# Patient Record
Sex: Male | Born: 1955 | Race: White | Hispanic: No | Marital: Married | Smoking: Former smoker
Health system: Southern US, Community
[De-identification: ages and names within clinical notes are randomized; demographics above are authoritative.]

## PROBLEM LIST (undated history)

## (undated) DIAGNOSIS — N2889 Other specified disorders of kidney and ureter: Secondary | ICD-10-CM

## (undated) DIAGNOSIS — M1990 Unspecified osteoarthritis, unspecified site: Secondary | ICD-10-CM

## (undated) DIAGNOSIS — C61 Malignant neoplasm of prostate: Secondary | ICD-10-CM

## (undated) DIAGNOSIS — E785 Hyperlipidemia, unspecified: Secondary | ICD-10-CM

## (undated) HISTORY — PX: APPENDECTOMY: SHX54

## (undated) HISTORY — PX: HAND SURGERY: SHX662

## (undated) HISTORY — PX: KNEE SURGERY: SHX244

---

## 2007-08-11 ENCOUNTER — Emergency Department: Payer: Self-pay | Admitting: Emergency Medicine

## 2009-11-14 ENCOUNTER — Emergency Department (HOSPITAL_COMMUNITY): Admission: EM | Admit: 2009-11-14 | Discharge: 2009-11-15 | Payer: Self-pay | Admitting: Emergency Medicine

## 2010-04-10 LAB — CBC
HCT: 42.3 % (ref 39.0–52.0)
Hemoglobin: 15 g/dL (ref 13.0–17.0)
MCH: 32.9 pg (ref 26.0–34.0)
MCV: 93 fL (ref 78.0–100.0)
RBC: 4.55 MIL/uL (ref 4.22–5.81)

## 2010-04-10 LAB — POCT CARDIAC MARKERS
CKMB, poc: <1 ng/mL — ABNORMAL LOW (ref 1.0–8.0)
Troponin i, poc: <0.05 ng/mL (ref 0.00–0.09)
Troponin i, poc: <0.05 ng/mL (ref 0.00–0.09)

## 2010-04-10 LAB — DIFFERENTIAL
Basophils Absolute: 0.1 10*3/uL (ref 0.0–0.1)
Basophils Relative: 1 % (ref 0–1)
Eosinophils Relative: 3 % (ref 0–5)
Lymphocytes Relative: 31 % (ref 12–46)
Neutro Abs: 4.8 10*3/uL (ref 1.7–7.7)

## 2010-04-10 LAB — COMPREHENSIVE METABOLIC PANEL
BUN: 15 mg/dL (ref 6–23)
CO2: 27 mEq/L (ref 19–32)
Chloride: 105 mEq/L (ref 96–112)
Creatinine, Ser: 1.26 mg/dL (ref 0.4–1.5)
GFR calc non Af Amer: 59 mL/min — ABNORMAL LOW (ref >60)
Total Bilirubin: 0.5 mg/dL (ref 0.3–1.2)

## 2018-11-17 DIAGNOSIS — F17200 Nicotine dependence, unspecified, uncomplicated: Secondary | ICD-10-CM | POA: Diagnosis not present

## 2018-11-17 DIAGNOSIS — Z1159 Encounter for screening for other viral diseases: Secondary | ICD-10-CM | POA: Diagnosis not present

## 2018-11-17 DIAGNOSIS — Z23 Encounter for immunization: Secondary | ICD-10-CM | POA: Diagnosis not present

## 2018-11-17 DIAGNOSIS — Z136 Encounter for screening for cardiovascular disorders: Secondary | ICD-10-CM | POA: Diagnosis not present

## 2018-11-17 DIAGNOSIS — Z125 Encounter for screening for malignant neoplasm of prostate: Secondary | ICD-10-CM | POA: Diagnosis not present

## 2018-11-17 DIAGNOSIS — Z0000 Encounter for general adult medical examination without abnormal findings: Secondary | ICD-10-CM | POA: Diagnosis not present

## 2018-11-17 DIAGNOSIS — R9431 Abnormal electrocardiogram [ECG] [EKG]: Secondary | ICD-10-CM | POA: Diagnosis not present

## 2018-11-17 DIAGNOSIS — R030 Elevated blood-pressure reading, without diagnosis of hypertension: Secondary | ICD-10-CM | POA: Diagnosis not present

## 2018-11-17 DIAGNOSIS — Z1211 Encounter for screening for malignant neoplasm of colon: Secondary | ICD-10-CM | POA: Diagnosis not present

## 2018-11-17 DIAGNOSIS — I4510 Unspecified right bundle-branch block: Secondary | ICD-10-CM | POA: Diagnosis not present

## 2018-11-22 ENCOUNTER — Other Ambulatory Visit: Payer: Self-pay | Admitting: Family Medicine

## 2018-11-22 DIAGNOSIS — Z136 Encounter for screening for cardiovascular disorders: Secondary | ICD-10-CM

## 2018-12-02 ENCOUNTER — Ambulatory Visit
Admission: RE | Admit: 2018-12-02 | Discharge: 2018-12-02 | Disposition: A | Discharge status: Home / Self Care | Payer: HMO | Source: Ambulatory Visit | Attending: Family Medicine | Admitting: Family Medicine

## 2018-12-02 DIAGNOSIS — Z136 Encounter for screening for cardiovascular disorders: Secondary | ICD-10-CM

## 2018-12-02 DIAGNOSIS — Z87891 Personal history of nicotine dependence: Secondary | ICD-10-CM | POA: Diagnosis not present

## 2018-12-07 DIAGNOSIS — R3912 Poor urinary stream: Secondary | ICD-10-CM | POA: Diagnosis not present

## 2018-12-07 DIAGNOSIS — N401 Enlarged prostate with lower urinary tract symptoms: Secondary | ICD-10-CM | POA: Diagnosis not present

## 2018-12-07 DIAGNOSIS — R350 Frequency of micturition: Secondary | ICD-10-CM | POA: Diagnosis not present

## 2018-12-07 DIAGNOSIS — R351 Nocturia: Secondary | ICD-10-CM | POA: Diagnosis not present

## 2018-12-07 DIAGNOSIS — R972 Elevated prostate specific antigen [PSA]: Secondary | ICD-10-CM | POA: Diagnosis not present

## 2018-12-22 ENCOUNTER — Ambulatory Visit: Payer: Self-pay | Admitting: Cardiovascular Disease

## 2019-01-13 DIAGNOSIS — C61 Malignant neoplasm of prostate: Secondary | ICD-10-CM | POA: Diagnosis not present

## 2019-01-13 DIAGNOSIS — R972 Elevated prostate specific antigen [PSA]: Secondary | ICD-10-CM | POA: Diagnosis not present

## 2019-01-19 DIAGNOSIS — C61 Malignant neoplasm of prostate: Secondary | ICD-10-CM | POA: Diagnosis not present

## 2019-02-04 ENCOUNTER — Ambulatory Visit: Payer: PPO | Admitting: Cardiovascular Disease

## 2019-02-04 ENCOUNTER — Encounter: Payer: Self-pay | Admitting: Cardiovascular Disease

## 2019-02-04 ENCOUNTER — Other Ambulatory Visit: Payer: Self-pay

## 2019-02-04 DIAGNOSIS — I4510 Unspecified right bundle-branch block: Secondary | ICD-10-CM | POA: Diagnosis not present

## 2019-02-04 DIAGNOSIS — E782 Mixed hyperlipidemia: Secondary | ICD-10-CM

## 2019-02-04 DIAGNOSIS — E785 Hyperlipidemia, unspecified: Secondary | ICD-10-CM | POA: Insufficient documentation

## 2019-02-04 DIAGNOSIS — Z720 Tobacco use: Secondary | ICD-10-CM | POA: Diagnosis not present

## 2019-02-04 DIAGNOSIS — Z01818 Encounter for other preprocedural examination: Secondary | ICD-10-CM | POA: Diagnosis not present

## 2019-02-04 DIAGNOSIS — I454 Nonspecific intraventricular block: Secondary | ICD-10-CM

## 2019-02-04 HISTORY — DX: Nonspecific intraventricular block: I45.4

## 2019-02-04 MED ORDER — ATORVASTATIN CALCIUM 40 MG PO TABS: 40.0000 mg | ORAL_TABLET | Freq: Every day | ORAL | 3 refills | Status: DC

## 2019-02-04 NOTE — Assessment & Plan Note (Signed)
Greater than 100 pack years but tobacco use smoking 2 packs a day until recently when he is cut back to 6 cigarettes a day with the desire to quit

## 2019-02-04 NOTE — Progress Notes (Signed)
02/04/2019 Jeremiah Richards   September 29, 1955  [id number]  Primary Physician Patient, No Pcp Per Primary Cardiologist: Lorretta Harp MD Garret Reddish, [place], [place]  HPI:  Jeremiah Richards is a 64 y.o. 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  He drives 2 to [id number] miles a day.  He was referred by Marilynne Drivers , PA-C because of right bundle branch block and preoperative clearance.  He has no prior cardiac history.  His risk factors include close to 100 pack years of tobacco abuse smoking 2 packs a day for last 50 years and currently down to 6 cigarettes a day with the intent to quit.  He has treated hyperlipidemia.  Is never had a heart attack or stroke.  There is no family history of heart disease.  He denies chest pain or shortness of breath.  He does have right bundle branch block of unknown chronicity.  He was recently diagnosed with prostate cancer and apparently needs radical prostatectomy as well as bad knees potentially needing knee replacement.  He denies chest pain or shortness of breath.   Current Meds  Medication Sig  . atorvastatin (LIPITOR) 10 MG tablet Take 10 mg by mouth daily.     No Known Allergies  Social History   Socioeconomic History  . Marital status: Married    Spouse name: Not on file  . Number of children: Not on file  . Years of education: Not on file  . Highest education level: Not on file  Occupational History  . Not on file  Tobacco Use  . Smoking status: Not on file  Substance and Sexual Activity  . Alcohol use: Not on file  . Drug use: Not on file  . Sexual activity: Not on file  Other Topics Concern  . Not on file  Social History Narrative  . Not on file   Social Determinants of Health   Financial Resource Strain:   . Difficulty of Paying Living Expenses: Not on file  Food Insecurity:   . Worried About Charity fundraiser in the Last Year: Not on file  . Ran Out of Food in the Last  Year: Not on file  Transportation Needs:   . Lack of Transportation (Medical): Not on file  . Lack of Transportation (Non-Medical): Not on file  Physical Activity:   . Days of Exercise per Week: Not on file  . Minutes of Exercise per Session: Not on file  Stress:   . Feeling of Stress : Not on file  Social Connections:   . Frequency of Communication with Friends and Family: Not on file  . Frequency of Social Gatherings with Friends and Family: Not on file  . Attends Religious Services: Not on file  . Active Member of Clubs or Organizations: Not on file  . Attends Archivist Meetings: Not on file  . Marital Status: Not on file  Intimate Partner Violence:   . Fear of Current or Ex-Partner: Not on file  . Emotionally Abused: Not on file  . Physically Abused: Not on file  . Sexually Abused: Not on file     Review of Systems: General: negative for chills, fever, night sweats or weight changes.  Cardiovascular: negative for chest pain, dyspnea on exertion, edema, orthopnea, palpitations, paroxysmal nocturnal dyspnea or shortness of breath Dermatological: negative for rash Respiratory: negative for cough or wheezing Urologic: negative for hematuria Abdominal: negative for nausea, vomiting,  diarrhea, bright red blood per rectum, melena, or hematemesis Neurologic: negative for visual changes, syncope, or dizziness All other systems reviewed and are otherwise negative except as noted above.    Blood pressure (!) 149/102, pulse 72, temperature (!) 96 F (35.6 C), height 6\' 2"  (1.88 m), weight 220 lb (99.8 kg), SpO2 96 %.  General appearance: alert and no distress Neck: no adenopathy, no carotid bruit, no JVD, supple, symmetrical, trachea midline and thyroid not enlarged, symmetric, no tenderness/mass/nodules Lungs: clear to auscultation bilaterally Heart: regular rate and rhythm, S1, S2 normal, no murmur, click, rub or gallop Extremities: extremities normal, atraumatic, no  cyanosis or edema Pulses: 2+ and symmetric Skin: Skin color, texture, turgor normal. No rashes or lesions Neurologic: Alert and oriented X 3, normal strength and tone. Normal symmetric reflexes. Normal coordination and gait  EKG sinus rhythm at 72 with right bundle branch block and left axis deviation.  I personally reviewed this EKG.  ASSESSMENT AND PLAN:   Right bundle branch block Unknown chronicity  Hyperlipidemia History of hyperlipidemia on low-dose atorvastatin with lipid profile performed 11/17/2018 revealing a total cholesterol of 267, LDL of 146 and HDL 46.  I am going to increase his atorvastatin from 10 to 40 mg a day.  Tobacco abuse Greater than 100 pack years but tobacco use smoking 2 packs a day until recently when he is cut back to 6 cigarettes a day with the desire to quit  Preoperative clearance Patient was recently diagnosed with prostate cancer needs prostatectomy.  He also has bad knees and his knee replacement.  Ongoing get a 2D echo and a coronary calcium score to further evaluate and risk stratify      Lorretta Harp MD [facility], [facility] 02/04/2019 11:51 AM

## 2019-02-04 NOTE — Assessment & Plan Note (Signed)
Unknown chronicity ?

## 2019-02-04 NOTE — Assessment & Plan Note (Signed)
Patient was recently diagnosed with prostate cancer needs prostatectomy.  He also has bad knees and his knee replacement.  Ongoing get a 2D echo and a coronary calcium score to further evaluate and risk stratify

## 2019-02-04 NOTE — Patient Instructions (Signed)
Medication Instructions:  Increase Atorvastatin to 40mg  Daily.  If you need a refill on your cardiac medications before your next appointment, please call your pharmacy.   Lab work: NONE  Testing/Procedures: .Your physician has requested that you have an echocardiogram. Echocardiography is a painless test that uses sound waves to create images of your heart. It provides your doctor with information about the size and shape of your heart and how well your heart's chambers and valves are working. This procedure takes approximately one hour. There are no restrictions for this procedure. [address]  AND  Coronary Calcium Score  Follow-Up: At [facility], you and your health needs are our priority.  As part of our continuing mission to provide you with exceptional heart care, we have created designated Provider Care Teams.  These Care Teams include your primary Cardiologist (physician) and Advanced Practice Providers (APPs -  Physician Assistants and Nurse Practitioners) who all work together to provide you with the care you need, when you need it. You may see Dr Gwenlyn Found or one of the following Advanced Practice Providers on your designated Care Team:    Kerin Ransom, PA-C  [place], [place]  Coletta Memos, [place]  Your physician wants you to follow-up in: 3 months with Dr Gwenlyn Found  Any Other Special Instructions Will Be Listed Below (If Applicable).   Coronary Calcium Scan A coronary calcium scan is an imaging test used to look for deposits of plaque in the inner lining of the blood vessels of the heart (coronary arteries). Plaque is made up of calcium, protein, and fatty substances. These deposits of plaque can partly clog and narrow the coronary arteries without producing any symptoms or warning signs. This puts a person at risk for a heart attack. This test is recommended for people who are at moderate risk for heart disease. The test can find plaque deposits before  symptoms develop. Tell a health care provider about:  Any allergies you have.  All medicines you are taking, including vitamins, herbs, eye drops, creams, and over-the-counter medicines.  Any problems you or family members have had with anesthetic medicines.  Any blood disorders you have.  Any surgeries you have had.  Any medical conditions you have.  Whether you are pregnant or may be pregnant. What are the risks? Generally, this is a safe procedure. However, problems may occur, including:  Harm to a pregnant woman and her unborn baby. This test involves the use of radiation. Radiation exposure can be dangerous to a pregnant woman and her unborn baby. If you are pregnant or think you may be pregnant, you should not have this procedure done.  Slight increase in the risk of cancer. This is because of the radiation involved in the test. What happens before the procedure? Ask your health care provider for any specific instructions on how to prepare for this procedure. You may be asked to avoid products that contain caffeine, tobacco, or nicotine for 4 hours before the procedure. What happens during the procedure?   You will undress and remove any jewelry from your neck or chest.  You will put on a hospital gown.  Sticky electrodes will be placed on your chest. The electrodes will be connected to an electrocardiogram (ECG) machine to record a tracing of the electrical activity of your heart.  You will lie down on a curved bed that is attached to the [place].  You may be given medicine to slow down your heart rate so  that clear pictures can be created.  You will be moved into the CT scanner, and the CT scanner will take pictures of your heart. During this time, you will be asked to lie still and hold your breath for 2-3 seconds at a time while each picture of your heart is being taken. The procedure may vary among health care providers and hospitals. What happens after the  procedure?  You can get dressed.  You can return to your normal activities.  It is up to you to get the results of your procedure. Ask your health care provider, or the department that is doing the procedure, when your results will be ready. Summary  A coronary calcium scan is an imaging test used to look for deposits of plaque in the inner lining of the blood vessels of the heart (coronary arteries). Plaque is made up of calcium, protein, and fatty substances.  Generally, this is a safe procedure. Tell your health care provider if you are pregnant or may be pregnant.  Ask your health care provider for any specific instructions on how to prepare for this procedure.  A CT scanner will take pictures of your heart.  You can return to your normal activities after the scan is done. This information is not intended to replace advice given to you by your health care provider. Make sure you discuss any questions you have with your health care provider. Document Revised: 08/03/2018 Document Reviewed: 08/03/2018 Elsevier Patient Education  [place].

## 2019-02-04 NOTE — Assessment & Plan Note (Signed)
History of hyperlipidemia on low-dose atorvastatin with lipid profile performed 11/17/2018 revealing a total cholesterol of 267, LDL of 146 and HDL 46.  I am going to increase his atorvastatin from 10 to 40 mg a day.

## 2019-02-11 ENCOUNTER — Other Ambulatory Visit: Payer: Self-pay

## 2019-02-11 ENCOUNTER — Ambulatory Visit (HOSPITAL_COMMUNITY): Payer: PPO | Attending: Cardiovascular Disease

## 2019-02-11 ENCOUNTER — Ambulatory Visit (INDEPENDENT_AMBULATORY_CARE_PROVIDER_SITE_OTHER)
Admission: RE | Admit: 2019-02-11 | Discharge: 2019-02-11 | Disposition: A | Discharge status: Home / Self Care | Payer: Self-pay | Source: Ambulatory Visit | Attending: Cardiovascular Disease | Admitting: Cardiovascular Disease

## 2019-02-11 DIAGNOSIS — I4510 Unspecified right bundle-branch block: Secondary | ICD-10-CM

## 2019-02-11 DIAGNOSIS — E782 Mixed hyperlipidemia: Secondary | ICD-10-CM

## 2019-02-11 DIAGNOSIS — Z720 Tobacco use: Secondary | ICD-10-CM | POA: Diagnosis not present

## 2019-02-11 DIAGNOSIS — Z01818 Encounter for other preprocedural examination: Secondary | ICD-10-CM

## 2019-02-14 DIAGNOSIS — E782 Mixed hyperlipidemia: Secondary | ICD-10-CM

## 2019-02-21 DIAGNOSIS — Z79899 Other long term (current) drug therapy: Secondary | ICD-10-CM | POA: Diagnosis not present

## 2019-02-21 DIAGNOSIS — E7800 Pure hypercholesterolemia, unspecified: Secondary | ICD-10-CM | POA: Diagnosis not present

## 2019-03-10 DIAGNOSIS — C61 Malignant neoplasm of prostate: Secondary | ICD-10-CM | POA: Diagnosis not present

## 2019-03-11 ENCOUNTER — Other Ambulatory Visit: Payer: Self-pay | Admitting: Urology

## 2019-04-15 ENCOUNTER — Other Ambulatory Visit: Payer: Self-pay | Admitting: Urology

## 2019-04-15 DIAGNOSIS — C61 Malignant neoplasm of prostate: Secondary | ICD-10-CM

## 2019-04-19 ENCOUNTER — Encounter (HOSPITAL_COMMUNITY): Payer: Self-pay

## 2019-04-19 NOTE — Patient Instructions (Addendum)
DUE TO COVID-19 ONLY TWO VISITORS ARE ALLOWED TO COME WITH YOU AND STAY IN THE WAITING ROOM ONLY DURING PRE OP AND PROCEDURE. THE TWO VISITORS MAY VISIT WITH YOU IN YOUR PRIVATE ROOM DURING VISITING HOURS ONLY!!   COVID SWAB TESTING MUST BE COMPLETED ON: Saturday, April 23, 2019 at 12:40PM [address], [facility] (Must self quarantine after testing. Follow instructions on handout.)             Your procedure is scheduled on: Wednesday, April 27, 2019   Report to [facility] Main  Entrance   Report to Short Stay at 5:30 AM   [facility])   Call this number if you have problems the morning of surgery [phone]   Do not eat food or drink liquids :After Midnight.   Oral Hygiene is also important to reduce your risk of infection.                                    Remember - BRUSH YOUR TEETH THE MORNING OF SURGERY WITH YOUR REGULAR TOOTHPASTE   Do NOT smoke after Midnight   Take these medicines the morning of surgery with A SIP OF WATER: None                               You may not have any metal on your body including jewelry, and body piercings             Do not wear lotions, powders, perfumes/cologne, or deodorant                          Men may shave face and neck.   Do not bring valuables to the hospital. [place].   Contacts, dentures or bridgework may not be worn into surgery.   Bring small overnight bag day of surgery.    Patients discharged the day of surgery will not be allowed to drive home.   Special Instructions: Bring a copy of your healthcare power of attorney and living will documents         the day of surgery if you haven't scanned them in before.              Please read over the following fact sheets you were given:  [facility] - Preparing for Surgery Before surgery, you can play an important role.  Because skin is not  sterile, your skin needs to be as free of germs as possible.  You can reduce the number of germs on your skin by washing with CHG (chlorahexidine gluconate) soap before surgery.  CHG is an antiseptic cleaner which kills germs and bonds with the skin to continue killing germs even after washing. Please DO NOT use if you have an allergy to CHG or antibacterial soaps.  If your skin becomes reddened/irritated stop using the CHG and inform your nurse when you arrive at Short Stay. Do not shave (including legs and underarms) for at least 48 hours prior to the first CHG shower.  You may shave your face/neck.  Please follow these instructions carefully:  1.  Shower with CHG Soap the night before surgery and  the  morning of surgery.  2.  If you choose to wash your hair, wash your hair first as usual with your normal  shampoo.  3.  After you shampoo, rinse your hair and body thoroughly to remove the shampoo.                             4.  Use CHG as you would any other liquid soap.  You can apply chg directly to the skin and wash.  Gently with a scrungie or clean washcloth.  5.  Apply the CHG Soap to your body ONLY FROM THE NECK DOWN.   Do   not use on face/ open                           Wound or open sores. Avoid contact with eyes, ears mouth and   genitals (private parts).                       Wash face,  Genitals (private parts) with your normal soap.             6.  Wash thoroughly, paying special attention to the area where your    surgery  will be performed.  7.  Thoroughly rinse your body with warm water from the neck down.  8.  DO NOT shower/wash with your normal soap after using and rinsing off the CHG Soap.                9.  Pat yourself dry with a clean towel.            10.  Wear clean pajamas.            11.  Place clean sheets on your bed the night of your first shower and do not  sleep with pets. Day of Surgery : Do not apply any lotions/deodorants the morning of surgery.  Please wear  clean clothes to the hospital/surgery center.  FAILURE TO FOLLOW THESE INSTRUCTIONS MAY RESULT IN THE CANCELLATION OF YOUR SURGERY  PATIENT SIGNATURE_________________________________  NURSE SIGNATURE__________________________________  ________________________________________________________________________  WHAT IS A BLOOD TRANSFUSION? Blood Transfusion Information  A transfusion is the replacement of blood or some of its parts. Blood is made up of multiple cells which provide different functions.  Red blood cells carry oxygen and are used for blood loss replacement.  White blood cells fight against infection.  Platelets control bleeding.  Plasma helps clot blood.  Other blood products are available for specialized needs, such as hemophilia or other clotting disorders. BEFORE THE TRANSFUSION  Who gives blood for transfusions?   Healthy volunteers who are fully evaluated to make sure their blood is safe. This is blood bank blood. Transfusion therapy is the safest it has ever been in the practice of medicine. Before blood is taken from a donor, a complete history is taken to make sure that person has no history of diseases nor engages in risky social behavior (examples are intravenous drug use or sexual activity with multiple partners). The donor's travel history is screened to minimize risk of transmitting infections, such as malaria. The donated blood is tested for signs of infectious diseases, such as HIV and hepatitis. The blood is then tested to be sure it is compatible with you in order to minimize the chance of a transfusion reaction. If you or a relative donates blood,  this is often done in anticipation of surgery and is not appropriate for emergency situations. It takes many days to process the donated blood. RISKS AND COMPLICATIONS Although transfusion therapy is very safe and saves many lives, the main dangers of transfusion include:   Getting an infectious  disease.  Developing a transfusion reaction. This is an allergic reaction to something in the blood you were given. Every precaution is taken to prevent this. The decision to have a blood transfusion has been considered carefully by your caregiver before blood is given. Blood is not given unless the benefits outweigh the risks. AFTER THE TRANSFUSION  Right after receiving a blood transfusion, you will usually feel much better and more energetic. This is especially true if your red blood cells have gotten low (anemic). The transfusion raises the level of the red blood cells which carry oxygen, and this usually causes an energy increase.  The nurse administering the transfusion will monitor you carefully for complications. HOME CARE INSTRUCTIONS  No special instructions are needed after a transfusion. You may find your energy is better. Speak with your caregiver about any limitations on activity for underlying diseases you may have. SEEK MEDICAL CARE IF:   Your condition is not improving after your transfusion.  You develop redness or irritation at the intravenous (IV) site. SEEK IMMEDIATE MEDICAL CARE IF:  Any of the following symptoms occur over the next 12 hours:  Shaking chills.  You have a temperature by mouth above 102 F (38.9 C), not controlled by medicine.  Chest, back, or muscle pain.  People around you feel you are not acting correctly or are confused.  Shortness of breath or difficulty breathing.  Dizziness and fainting.  You get a rash or develop hives.  You have a decrease in urine output.  Your urine turns a dark color or changes to pink, red, or brown. Any of the following symptoms occur over the next 10 days:  You have a temperature by mouth above 102 F (38.9 C), not controlled by medicine.  Shortness of breath.  Weakness after normal activity.  The white part of the eye turns yellow (jaundice).  You have a decrease in the amount of urine or are  urinating less often.  Your urine turns a dark color or changes to pink, red, or brown. Document Released: 01/11/2000 Document Revised: 04/07/2011 Document Reviewed: 08/30/2007 [facility] Patient Information 2014 [place], [place].  _______________________________________________________________________

## 2019-04-20 ENCOUNTER — Encounter (HOSPITAL_COMMUNITY)
Admission: RE | Admit: 2019-04-20 | Discharge: 2019-04-20 | Disposition: A | Discharge status: Home / Self Care | Payer: PPO | Source: Ambulatory Visit | Attending: Urology | Admitting: Urology

## 2019-04-20 ENCOUNTER — Encounter (HOSPITAL_COMMUNITY): Payer: Self-pay

## 2019-04-20 ENCOUNTER — Other Ambulatory Visit: Payer: Self-pay

## 2019-04-20 DIAGNOSIS — C61 Malignant neoplasm of prostate: Secondary | ICD-10-CM | POA: Diagnosis not present

## 2019-04-20 DIAGNOSIS — M62838 Other muscle spasm: Secondary | ICD-10-CM | POA: Diagnosis not present

## 2019-04-20 DIAGNOSIS — R3912 Poor urinary stream: Secondary | ICD-10-CM | POA: Diagnosis not present

## 2019-04-20 DIAGNOSIS — M6281 Muscle weakness (generalized): Secondary | ICD-10-CM | POA: Diagnosis not present

## 2019-04-20 DIAGNOSIS — E785 Hyperlipidemia, unspecified: Secondary | ICD-10-CM | POA: Insufficient documentation

## 2019-04-20 DIAGNOSIS — R350 Frequency of micturition: Secondary | ICD-10-CM | POA: Diagnosis not present

## 2019-04-20 DIAGNOSIS — Z01812 Encounter for preprocedural laboratory examination: Secondary | ICD-10-CM | POA: Insufficient documentation

## 2019-04-20 DIAGNOSIS — M6289 Other specified disorders of muscle: Secondary | ICD-10-CM | POA: Diagnosis not present

## 2019-04-20 DIAGNOSIS — F17210 Nicotine dependence, cigarettes, uncomplicated: Secondary | ICD-10-CM | POA: Insufficient documentation

## 2019-04-20 HISTORY — DX: Hyperlipidemia, unspecified: E78.5

## 2019-04-20 HISTORY — DX: Unspecified osteoarthritis, unspecified site: M19.90

## 2019-04-20 HISTORY — DX: Malignant neoplasm of prostate: C61

## 2019-04-20 NOTE — Progress Notes (Signed)
PCP - Dr. Elissa Hefty [facility]  Cardiologist - Dr. Adora Fridge last office visit and pre op exam 02/04/19 in epic  Chest x-ray - greater than 1 year EKG - 02/04/19 in epic Stress Test - N/A ECHO - 02/11/19 in epic Cardiac Cath - N/A  Sleep Study - N/A CPAP - N/A  Fasting Blood Sugar - N/A Checks Blood Sugar _N/A____ times a day  Blood Thinner Instructions: N/A Aspirin Instructions: N/A Last Dose: N/A  Anesthesia review:  N/A  Patient denies shortness of breath, fever, cough and chest pain at PAT appointment   Patient verbalized understanding of instructions that were given to them at the PAT appointment. Patient was also instructed that they will need to review over the PAT instructions again at home before surgery.

## 2019-04-21 ENCOUNTER — Encounter (HOSPITAL_COMMUNITY)
Admission: RE | Admit: 2019-04-21 | Discharge: 2019-04-21 | Disposition: A | Discharge status: Home / Self Care | Payer: PPO | Source: Ambulatory Visit | Attending: Urology | Admitting: Urology

## 2019-04-21 DIAGNOSIS — F17210 Nicotine dependence, cigarettes, uncomplicated: Secondary | ICD-10-CM | POA: Diagnosis not present

## 2019-04-21 DIAGNOSIS — C61 Malignant neoplasm of prostate: Secondary | ICD-10-CM | POA: Diagnosis not present

## 2019-04-21 DIAGNOSIS — Z01812 Encounter for preprocedural laboratory examination: Secondary | ICD-10-CM | POA: Diagnosis not present

## 2019-04-21 DIAGNOSIS — E785 Hyperlipidemia, unspecified: Secondary | ICD-10-CM | POA: Diagnosis not present

## 2019-04-21 LAB — CBC
HCT: 46.9 % (ref 39.0–52.0)
Hemoglobin: 15.9 g/dL (ref 13.0–17.0)
MCH: 32 pg (ref 26.0–34.0)
MCHC: 33.9 g/dL (ref 30.0–36.0)
MCV: 94.4 fL (ref 80.0–100.0)
Platelets: 192 10*3/uL (ref 150–400)
RBC: 4.97 MIL/uL (ref 4.22–5.81)
RDW: 12.8 % (ref 11.5–15.5)
WBC: 7.5 10*3/uL (ref 4.0–10.5)
nRBC: 0 % (ref 0.0–0.2)

## 2019-04-21 LAB — BASIC METABOLIC PANEL
Anion gap: 7 (ref 5–15)
BUN: 18 mg/dL (ref 8–23)
CO2: 27 mmol/L (ref 22–32)
Calcium: 9.1 mg/dL (ref 8.9–10.3)
Chloride: 106 mmol/L (ref 98–111)
Creatinine, Ser: 0.91 mg/dL (ref 0.61–1.24)
GFR calc Af Amer: >60 mL/min (ref >60)
GFR calc non Af Amer: >60 mL/min (ref >60)
Glucose, Bld: 96 mg/dL (ref 70–99)
Potassium: 4.4 mmol/L (ref 3.5–5.1)
Sodium: 140 mmol/L (ref 135–145)

## 2019-04-21 LAB — PROTIME-INR
INR: 0.9 (ref 0.8–1.2)
Prothrombin Time: 12.4 seconds (ref 11.4–15.2)

## 2019-04-21 LAB — ABO/RH: ABO/RH(D): A POS

## 2019-04-23 ENCOUNTER — Other Ambulatory Visit (HOSPITAL_COMMUNITY)
Admission: RE | Admit: 2019-04-23 | Discharge: 2019-04-23 | Disposition: A | Discharge status: Home / Self Care | Payer: PPO | Source: Ambulatory Visit | Attending: Urology | Admitting: Urology

## 2019-04-23 DIAGNOSIS — Z01812 Encounter for preprocedural laboratory examination: Secondary | ICD-10-CM | POA: Insufficient documentation

## 2019-04-23 DIAGNOSIS — Z20822 Contact with and (suspected) exposure to covid-19: Secondary | ICD-10-CM | POA: Diagnosis not present

## 2019-04-23 LAB — SARS CORONAVIRUS 2 (TAT 6-24 HRS): SARS Coronavirus 2: NEGATIVE

## 2019-04-26 ENCOUNTER — Encounter (HOSPITAL_COMMUNITY): Payer: Self-pay | Admitting: Certified Registered"

## 2019-04-26 NOTE — Anesthesia Preprocedure Evaluation (Deleted)
Anesthesia Evaluation    Reviewed: Allergy & Precautions, H&P , Patient's Chart, lab work & pertinent test results  Airway        Dental   Pulmonary neg pulmonary ROS, Current Smoker,           Cardiovascular Exercise Tolerance: Good   ECHO 1/21 Left Ventricle: Left ventricular ejection fraction, by visual estimation,  is 55 to 60%. The left ventricle has normal function. The left ventricle  has no regional wall motion abnormalities. There is no left ventricular  hypertrophy. Left ventricular  diastolic parameters were normal.  EKG   RBBB   Neuro/Psych negative neurological ROS  negative psych ROS   GI/Hepatic negative GI ROS, Neg liver ROS,   Endo/Other  negative endocrine ROS  Renal/GU negative Renal ROS  negative genitourinary   Musculoskeletal  (+) Arthritis , Osteoarthritis,    Abdominal   Peds  Hematology negative hematology ROS (+)   Anesthesia Other Findings   Reproductive/Obstetrics negative OB ROS                            Anesthesia Physical Anesthesia Plan  ASA: II  Anesthesia Plan: General   Post-op Pain Management:    Induction: Intravenous  PONV Risk Score and Plan: 2 and Dexamethasone and Ondansetron  Airway Management Planned: Oral ETT  Additional Equipment:   Intra-op Plan:   Post-operative Plan: Extubation in OR  Informed Consent: I have reviewed the patients History and Physical, chart, labs and discussed the procedure including the risks, benefits and alternatives for the proposed anesthesia with the patient or authorized representative who has indicated his/her understanding and acceptance.       Plan Discussed with: Anesthesiologist and CRNA  Anesthesia Plan Comments: (  )        Anesthesia Quick Evaluation

## 2019-04-27 LAB — TYPE AND SCREEN
ABO/RH(D): A POS
Antibody Screen: NEGATIVE

## 2019-04-28 DIAGNOSIS — D759 Disease of blood and blood-forming organs, unspecified: Secondary | ICD-10-CM

## 2019-04-28 DIAGNOSIS — M1712 Unilateral primary osteoarthritis, left knee: Secondary | ICD-10-CM | POA: Diagnosis not present

## 2019-04-28 DIAGNOSIS — S83241A Other tear of medial meniscus, current injury, right knee, initial encounter: Secondary | ICD-10-CM | POA: Diagnosis not present

## 2019-04-28 HISTORY — DX: Disease of blood and blood-forming organs, unspecified: D75.9

## 2019-05-02 ENCOUNTER — Other Ambulatory Visit (HOSPITAL_COMMUNITY): Payer: Self-pay | Admitting: Orthopedic Surgery

## 2019-05-02 DIAGNOSIS — M79605 Pain in left leg: Secondary | ICD-10-CM

## 2019-05-02 DIAGNOSIS — M7989 Other specified soft tissue disorders: Secondary | ICD-10-CM

## 2019-05-02 DIAGNOSIS — M1712 Unilateral primary osteoarthritis, left knee: Secondary | ICD-10-CM | POA: Diagnosis not present

## 2019-05-02 DIAGNOSIS — M25562 Pain in left knee: Secondary | ICD-10-CM | POA: Diagnosis not present

## 2019-05-03 ENCOUNTER — Ambulatory Visit (HOSPITAL_COMMUNITY)
Admission: RE | Admit: 2019-05-03 | Discharge: 2019-05-03 | Disposition: A | Discharge status: Home / Self Care | Payer: PPO | Source: Ambulatory Visit | Attending: Orthopedic Surgery | Admitting: Orthopedic Surgery

## 2019-05-03 ENCOUNTER — Other Ambulatory Visit: Payer: Self-pay

## 2019-05-03 DIAGNOSIS — M79605 Pain in left leg: Secondary | ICD-10-CM | POA: Diagnosis not present

## 2019-05-03 DIAGNOSIS — M7989 Other specified soft tissue disorders: Secondary | ICD-10-CM | POA: Diagnosis not present

## 2019-05-03 DIAGNOSIS — I82492 Acute embolism and thrombosis of other specified deep vein of left lower extremity: Secondary | ICD-10-CM | POA: Diagnosis not present

## 2019-05-03 NOTE — Progress Notes (Addendum)
Lower venous duplex       has been completed. Preliminary results can be found under CV proc through chart review. June Leap, BS, RDMS, RVT    Called positive results to Elnita Maxwell. Patient instructed to go to doctor's office now for treatment.

## 2019-05-04 ENCOUNTER — Ambulatory Visit: Payer: PPO | Admitting: Cardiovascular Disease

## 2019-05-10 NOTE — Progress Notes (Signed)
Cardiology Clinic Note   Patient Name: [facility] Date of Encounter: 05/11/2019  Primary Care Provider:  Lorretta Harp, MD Primary Cardiologist:  Quay Burow, MD  Patient Profile    Jeremiah Richards 64 year old male presents today for follow-up evaluation of his right bundle branch block, and hyperlipidemia.  He is also seen today for preoperative cardiac evaluation.  Past Medical History    Past Medical History:  Diagnosis Date  . BBB (bundle branch block) 02/04/2019   Noted on EKG  . Hyperlipidemia   . OA (osteoarthritis)   . Prostate cancer [facility])    Past Surgical History:  Procedure Laterality Date  . APPENDECTOMY    . HAND SURGERY Right   . KNEE SURGERY Left    after snow skiing accident age 42    Allergies  No Known Allergies  History of Present Illness    [name] has a PMH of right bundle branch block and hyperlipidemia.  He presents today for preoperative cardiac evaluation.  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  He was initially referred by Marilynne Drivers, PA-C in January 2021 for his right bundle branch block and preoperative cardiac evaluation.  He has no prior cardiac history.  He has 100-pack-year smoking history.  Is recently cut down to about 6 cigarettes/day and attempt to quit smoking.  His hyperlipidemia is treated.  He has no history of MI or CVA.  He has no family history of heart disease.  During his last visit with Dr. Gwenlyn Found on 02/04/2019 he denied chest pain, shortness of breath.  He was noted to have right arm branch block of unknown chronicity.  He was diagnosed with prostate cancer and needs radical prostatectomy.  He would also like to have bilateral knee replacement.  His echocardiogram showed normal LVEF and no significant abnormalities.  He also underwent lower extremity venous Dopplers which showed an acute DVT involving his left peroneal veins and left soleal veins.  No evidence of DVT in right lower extremity.  Patient was instructed to  present to his PCP for treatment 05/03/2019.  He presents to the clinic today for further evaluation and states he feels well today.  He had a lower extremity venous Doppler on 05/03/2019 showed a left lower extremity DVT.  He was started on Xarelto at that time.  He has been followed by his PCP and Dr. Tamala Julian at [facility] physicians.  He has increased his physical activity and is walking several times per day with his dog and in his backyard.  He has stopped smoking.  He states he is breathing much better and no longer coughing from smoking.  I will have him follow-up in 3 months for reevaluation and cardiac clearance.  Today he denies chest pain, shortness of breath, lower extremity edema, fatigue, palpitations, melena, hematuria, hemoptysis, diaphoresis, weakness, presyncope, syncope, orthopnea, and PND.   Home Medications    Prior to Admission medications   Medication Sig Start Date End Date Taking? Authorizing Provider  atorvastatin (LIPITOR) 40 MG tablet Take 1 tablet (40 mg total) by mouth daily. Patient taking differently: Take 40 mg by mouth daily. 3 pm 02/04/19   Lorretta Harp, MD  cholecalciferol (VITAMIN D) 25 MCG (1000 UNIT) tablet Take 1,000 Units by mouth daily.    [provider]  Multiple Vitamins-Minerals (MULTIVITAMIN WITH MINERALS) tablet Take 1 tablet by mouth daily.    [provider]  ZINC-VITAMIN C PO Take 1 tablet by mouth daily.    [provider]    Family History    No family history on file. has no family status information on file.   Social History    Social History   Socioeconomic History  . Marital status: Married    Spouse name: Not on file  . Number of children: Not on file  . Years of education: Not on file  . Highest education level: Not on file  Occupational History  . Not on file  Tobacco Use  . Smoking status: Former Smoker    Packs/day: 1.00    Years: 46.00    Pack years: 46.00    Types: Cigarettes  . Smokeless  tobacco: Never Used  Substance and Sexual Activity  . Alcohol use: Not Currently  . Drug use: Yes    Types: Marijuana    Comment: 3-4 week plus or minus  . Sexual activity: Not on file  Other Topics Concern  . Not on file  Social History Narrative  . Not on file   Social Determinants of Health   Financial Resource Strain:   . Difficulty of Paying Living Expenses:   Food Insecurity:   . Worried About Charity fundraiser in the Last Year:   . Arboriculturist in the Last Year:   Transportation Needs:   . Film/video editor (Medical):   Marland Kitchen Lack of Transportation (Non-Medical):   Physical Activity:   . Days of Exercise per Week:   . Minutes of Exercise per Session:   Stress:   . Feeling of Stress :   Social Connections:   . Frequency of Communication with Friends and Family:   . Frequency of Social Gatherings with Friends and Family:   . Attends Religious Services:   . Active Member of Clubs or Organizations:   . Attends Archivist Meetings:   Marland Kitchen Marital Status:   Intimate Partner Violence:   . Fear of Current or Ex-Partner:   . Emotionally Abused:   Marland Kitchen Physically Abused:   . Sexually Abused:      Review of Systems    General:  No chills, fever, night sweats or weight changes.  Cardiovascular:  No chest pain, dyspnea on exertion, edema, orthopnea, palpitations, paroxysmal nocturnal dyspnea. Dermatological: No rash, lesions/masses Respiratory: No cough, dyspnea Urologic: No hematuria, dysuria Abdominal:   No nausea, vomiting, diarrhea, bright red blood per rectum, melena, or hematemesis Neurologic:  No visual changes, wkns, changes in mental status. All other systems reviewed and are otherwise negative except as noted above.  Physical Exam    VS:  BP 132/80   Pulse 70   Ht 6\' 2"  (1.88 m)   Wt 215 lb (97.5 kg)   BMI 27.60 kg/m  , BMI Body mass index is 27.6 kg/m. GEN: Well nourished, well developed, in no acute distress. HEENT: normal. Neck: Supple,  no JVD, carotid bruits, or masses. Cardiac: RRR, no murmurs, rubs, or gallops. No clubbing, cyanosis, edema.  Radials/DP/PT 2+ and equal bilaterally.  Respiratory:  Respirations regular and unlabored, clear to auscultation bilaterally. GI: Soft, nontender, nondistended, BS + x 4. MS: no deformity or atrophy. Skin: warm and dry, no rash. Neuro:  Strength and sensation are intact. Psych: Normal affect.  Accessory Clinical Findings    ECG personally reviewed by me today-none today.  EKG 02/04/2019 Normal sinus rhythm 72 bpm right bundle branch block with left axis deviation  Echocardiogram 02/11/2019 IMPRESSIONS    1. Left ventricular ejection fraction, by visual estimation, is 55 to  60%. The left ventricle has normal function. There is no left ventricular  hypertrophy.  2. The left ventricle has no regional wall motion abnormalities.  3. Global right ventricle has normal systolic function.The right  ventricular size is normal. No increase in right ventricular wall  thickness.  4. Left atrial size was normal.  5. Right atrial size was normal.  6. The mitral valve is normal in structure. No evidence of mitral valve  regurgitation. No evidence of mitral stenosis.  7. The tricuspid valve is normal in structure.  8. The aortic valve is normal in structure. Aortic valve regurgitation is  not visualized. No evidence of aortic valve sclerosis or stenosis.  9. The pulmonic valve was normal in structure. Pulmonic valve  regurgitation is not visualized.  10. The inferior vena cava is normal in size with greater than 50%  respiratory variability, suggesting right atrial pressure of 3 mmHg.   Coronary CTA 02/11/2019 FINDINGS: Vascular: Heart is normal size.  Visualized aorta normal caliber.  Mediastinum/Nodes: No adenopathy in the lower mediastinum or hila.  Lungs/Pleura: Calcified granulomas in the left upper lobe. Air-filled cystic area in the right middle lobe. No  effusions.  Upper Abdomen: 2 cm low-density lesion in the anterior liver cannot be characterized on this noncontrast study.  Musculoskeletal: Chest wall soft tissues are unremarkable. No acute bony abnormality.  IMPRESSION: Old granulomatous disease.  2.7 cm air-filled cyst in the right middle lobe. This has a benign appearance but can be followed with repeat CT in 6 months to ensure stability.  Assessment & Plan   1.  Left lower extremity DVT-no left lower extremity edema, erythema, or pain.Marland KitchenHe a underwent lower extremity venous Dopplers which showed an acute DVT involving his left peroneal veins and left soleal veins.  No evidence of DVT in right lower extremity.  He indicated that PCP may want him to continue Xarelto for 6 months. Followed by PCP and Dr. [name], continue current therapy.  [facility])  Hyperlipidemia-LDL 146 11/17/2018. Heart healthy low-sodium high-fiber diet Continue atorvastatin 40 mg tablet daily Order  lipid panel  Preoperative cardiac evaluation-in light of his current treatment for DVT.  Will need reevaluation in 3 months.    Tobacco abuse- stopped smoking.  Congratulated about cessation.   Disposition: Follow-up with Dr. Gwenlyn Found in 3 months.  Jossie Ng. [facility] [place] [address] Office [phone] Fax [phone]

## 2019-05-11 ENCOUNTER — Encounter: Payer: Self-pay | Admitting: General Practice

## 2019-05-11 ENCOUNTER — Other Ambulatory Visit: Payer: Self-pay

## 2019-05-11 ENCOUNTER — Ambulatory Visit: Payer: PPO | Admitting: General Practice

## 2019-05-11 VITALS — BP 132/80 | HR 70 | Ht 74.0 in | Wt 215.0 lb

## 2019-05-11 DIAGNOSIS — Z79899 Other long term (current) drug therapy: Secondary | ICD-10-CM

## 2019-05-11 DIAGNOSIS — E782 Mixed hyperlipidemia: Secondary | ICD-10-CM | POA: Diagnosis not present

## 2019-05-11 DIAGNOSIS — Z720 Tobacco use: Secondary | ICD-10-CM | POA: Diagnosis not present

## 2019-05-11 DIAGNOSIS — Z01818 Encounter for other preprocedural examination: Secondary | ICD-10-CM | POA: Diagnosis not present

## 2019-05-11 DIAGNOSIS — I82462 Acute embolism and thrombosis of left calf muscular vein: Secondary | ICD-10-CM | POA: Diagnosis not present

## 2019-05-11 NOTE — Patient Instructions (Addendum)
Medication Instructions:  The current medical regimen is effective;  continue present plan and medications as directed. Please refer to the Current Medication list given to you today. *If you need a refill on your cardiac medications before your next appointment, please call your pharmacy*  Special Instructions EVERY 2 HOURS DO LEG EXERCISES OR ANKLE PUMPS(AS DISCUSSED WITH JESSE)  *AFTER FASTING LIPID PANEL THIS WEEK.  Follow-Up: Your next appointment:  3 month(s) Please call our office 2 months in advance to schedule this appointment In Person with Quay Burow, MD.  At [facility], you and your health needs are our priority.  As part of our continuing mission to provide you with exceptional heart care, we have created designated Provider Care Teams.  These Care Teams include your primary Cardiologist (physician) and Advanced Practice Providers (APPs -  Physician Assistants and Nurse Practitioners) who all work together to provide you with the care you need, when you need it.  We recommend signing up for the patient portal called "MyChart".  Sign up information is provided on this After Visit Summary.  MyChart is used to connect with patients for Virtual Visits (Telemedicine).  Patients are able to view lab/test results, encounter notes, upcoming appointments, etc.  Non-urgent messages can be sent to your provider as well.   To learn more about what you can do with MyChart, go to [email or web address].

## 2019-05-11 NOTE — Patient Instructions (Signed)
DUE TO COVID-19 ONLY ONE VISITOR IS ALLOWED TO COME WITH YOU AND STAY IN THE WAITING ROOM ONLY DURING PRE OP AND PROCEDURE DAY OF SURGERY. THE 1 VISITOR MAY VISIT WITH YOU AFTER SURGERY IN YOUR PRIVATE ROOM DURING VISITING HOURS ONLY!  YOU NEED TO HAVE A COVID 19 TEST ON_______ @_______ , THIS TEST MUST BE DONE BEFORE SURGERY, COME  [place], [place] , [address].  ([place]) ONCE YOUR COVID TEST IS COMPLETED, PLEASE BEGIN THE QUARANTINE INSTRUCTIONS AS OUTLINED IN YOUR HANDOUT.                Hawkins Duyck Wantz  05/11/2019   Your procedure is scheduled on: 05-23-19    Report to [facility] Main  Entrance    Report to admitting at 10:00  AM     Call this number if you have problems the morning of surgery [phone]    Remember: Do not eat food or drink liquids :After Midnight.     Take these medicines the morning of surgery with A SIP OF WATER: None   BRUSH YOUR TEETH MORNING OF SURGERY AND RINSE YOUR MOUTH OUT, NO CHEWING GUM CANDY OR MINTS.                                You may not have any metal on your body including hair pins and              piercings     Do not wear jewelry, cologne, lotions, powders or deodorant                      Men may shave face and neck.   Do not bring valuables to the hospital. [place].  Contacts, dentures or bridgework may not be worn into surgery.  You may bring overnight bag   Special Instructions: N/A              Please read over the following fact sheets you were given: _____________________________________________________________________             [facility] - Preparing for Surgery Before surgery, you can play an important role.  Because skin is not sterile, your skin needs to be as free of germs as possible.  You can reduce the number of germs on your skin by washing with CHG (chlorahexidine gluconate) soap before surgery.  CHG is an antiseptic  cleaner which kills germs and bonds with the skin to continue killing germs even after washing. Please DO NOT use if you have an allergy to CHG or antibacterial soaps.  If your skin becomes reddened/irritated stop using the CHG and inform your nurse when you arrive at Short Stay. Do not shave (including legs and underarms) for at least 48 hours prior to the first CHG shower.  You may shave your face/neck. Please follow these instructions carefully:  1.  Shower with CHG Soap the night before surgery and the  morning of Surgery.  2.  If you choose to wash your hair, wash your hair first as usual with your  normal  shampoo.  3.  After you shampoo, rinse your hair and body thoroughly to remove the  shampoo.  4.  Use CHG as you would any other liquid soap.  You can apply chg directly  to the skin and wash                       Gently with a scrungie or clean washcloth.  5.  Apply the CHG Soap to your body ONLY FROM THE NECK DOWN.   Do not use on face/ open                           Wound or open sores. Avoid contact with eyes, ears mouth and genitals (private parts).                       Wash face,  Genitals (private parts) with your normal soap.             6.  Wash thoroughly, paying special attention to the area where your surgery  will be performed.  7.  Thoroughly rinse your body with warm water from the neck down.  8.  DO NOT shower/wash with your normal soap after using and rinsing off  the CHG Soap.                9.  Pat yourself dry with a clean towel.            10.  Wear clean pajamas.            11.  Place clean sheets on your bed the night of your first shower and do not  sleep with pets. Day of Surgery : Do not apply any lotions/deodorants the morning of surgery.  Please wear clean clothes to the hospital/surgery center.  FAILURE TO FOLLOW THESE INSTRUCTIONS MAY RESULT IN THE CANCELLATION OF YOUR SURGERY PATIENT  SIGNATURE_________________________________  NURSE SIGNATURE__________________________________  ________________________________________________________________________

## 2019-05-11 NOTE — Progress Notes (Signed)
PCP - Marilynne Drivers, PA Cardiologist - Val Riles, MD  Chest x-ray -  EKG - 02-04-19 Stress Test -  ECHO - 02-11-19 Cardiac Cath -   Sleep Study -  CPAP -   Fasting Blood Sugar -  Checks Blood Sugar _____ times a day  Blood Thinner Instructions: Aspirin Instructions: Last Dose:  Anesthesia review: Hx of DVT  Patient denies shortness of breath, fever, cough and chest pain at PAT appointment   Patient verbalized understanding of instructions that were given to them at the PAT appointment. Patient was also instructed that they will need to review over the PAT instructions again at home before surgery.

## 2019-05-12 ENCOUNTER — Encounter (HOSPITAL_COMMUNITY): Admission: RE | Admit: 2019-05-12 | Payer: PPO | Source: Ambulatory Visit

## 2019-05-12 ENCOUNTER — Encounter (HOSPITAL_COMMUNITY)
Admission: RE | Admit: 2019-05-12 | Discharge: 2019-05-12 | Disposition: A | Discharge status: Home / Self Care | Payer: PPO | Source: Ambulatory Visit | Attending: Urology | Admitting: Urology

## 2019-05-12 NOTE — Progress Notes (Addendum)
Reached out to patient for his scheduled  telephonic pre-surgical appointment. Per pt, he feels that surgery will be cancelled, as he was not cleared by cardiologist. Pt was under the impression that the cardiologist's office would reach out to Dr. Purvis Sheffield office to advise them of their finding.   Advised patient that I would contact Dr. Purvis Sheffield office and speak to his scheduler. Voice message left for Darrel Reach at [facility], to contact patient regarding the above.

## 2019-05-14 ENCOUNTER — Ambulatory Visit: Payer: PPO | Attending: Internal Medicine

## 2019-05-14 DIAGNOSIS — Z23 Encounter for immunization: Secondary | ICD-10-CM

## 2019-05-14 NOTE — Progress Notes (Signed)
   Covid-19 Vaccination Clinic  Name:  BRIC RARDIN    MRN: [id number] DOB: [birth year]  05/14/2019  Mr. Gotz was observed post Covid-19 immunization for 15 minutes without incident. He was provided with Vaccine Information Sheet and instruction to access the V-Safe system.   Mr. Seagren was instructed to call 911 with any severe reactions post vaccine: Marland Kitchen Difficulty breathing  . Swelling of face and throat  . A fast heartbeat  . A bad rash all over body  . Dizziness and weakness   Immunizations Administered    Name Date Dose VIS Date Route   Pfizer COVID-19 Vaccine 05/14/2019 10:14 AM 0.3 mL 01/07/2019 Intramuscular   Manufacturer: [place]   Lot: [id number]   [place]: [id number]

## 2019-05-16 ENCOUNTER — Other Ambulatory Visit: Payer: PPO

## 2019-05-23 ENCOUNTER — Ambulatory Visit (HOSPITAL_COMMUNITY): Admission: RE | Admit: 2019-05-23 | Payer: PPO | Source: Ambulatory Visit | Admitting: Urology

## 2019-05-23 ENCOUNTER — Encounter (HOSPITAL_COMMUNITY): Admission: RE | Payer: Self-pay | Source: Ambulatory Visit

## 2019-05-23 DIAGNOSIS — I82462 Acute embolism and thrombosis of left calf muscular vein: Secondary | ICD-10-CM | POA: Diagnosis not present

## 2019-05-23 DIAGNOSIS — Z720 Tobacco use: Secondary | ICD-10-CM | POA: Diagnosis not present

## 2019-05-23 DIAGNOSIS — E782 Mixed hyperlipidemia: Secondary | ICD-10-CM | POA: Diagnosis not present

## 2019-05-23 DIAGNOSIS — Z01818 Encounter for other preprocedural examination: Secondary | ICD-10-CM | POA: Diagnosis not present

## 2019-05-23 DIAGNOSIS — Z79899 Other long term (current) drug therapy: Secondary | ICD-10-CM | POA: Diagnosis not present

## 2019-05-23 SURGERY — PROSTATECTOMY, RADICAL, ROBOT-ASSISTED, LAPAROSCOPIC
Anesthesia: General

## 2019-05-24 DIAGNOSIS — I824Z9 Acute embolism and thrombosis of unspecified deep veins of unspecified distal lower extremity: Secondary | ICD-10-CM | POA: Diagnosis not present

## 2019-05-24 LAB — LIPID PANEL
Chol/HDL Ratio: 2.9 ratio (ref 0.0–5.0)
Cholesterol, Total: 184 mg/dL (ref 100–199)
HDL: 64 mg/dL (ref >39)
LDL Chol Calc (NIH): 96 mg/dL (ref 0–99)
Triglycerides: 137 mg/dL (ref 0–149)
VLDL Cholesterol Cal: 24 mg/dL (ref 5–40)

## 2019-05-26 DIAGNOSIS — C61 Malignant neoplasm of prostate: Secondary | ICD-10-CM | POA: Diagnosis not present

## 2019-05-26 DIAGNOSIS — R3121 Asymptomatic microscopic hematuria: Secondary | ICD-10-CM | POA: Diagnosis not present

## 2019-06-07 ENCOUNTER — Ambulatory Visit: Payer: PPO | Attending: Internal Medicine

## 2019-06-07 DIAGNOSIS — Z23 Encounter for immunization: Secondary | ICD-10-CM

## 2019-06-07 NOTE — Progress Notes (Signed)
   Covid-19 Vaccination Clinic  Name:  Jeremiah Richards    MRN: [id number] DOB: 04-01-1955  06/07/2019  [name] was observed post Covid-19 immunization for 15 minutes without incident. He was provided with Vaccine Information Sheet and instruction to access the V-Safe system.   [name] was instructed to call 911 with any severe reactions post vaccine: Marland Kitchen Difficulty breathing  . Swelling of face and throat  . A fast heartbeat  . A bad rash all over body  . Dizziness and weakness   Immunizations Administered    Name Date Dose VIS Date Route   Pfizer COVID-19 Vaccine 06/07/2019  9:58 AM 0.3 mL 03/23/2018 Intramuscular   Manufacturer: [place]   Lot: [id number]   [place]: [id number]

## 2019-06-10 DIAGNOSIS — R3121 Asymptomatic microscopic hematuria: Secondary | ICD-10-CM | POA: Diagnosis not present

## 2019-06-10 DIAGNOSIS — R3129 Other microscopic hematuria: Secondary | ICD-10-CM | POA: Diagnosis not present

## 2019-06-10 DIAGNOSIS — K5730 Diverticulosis of large intestine without perforation or abscess without bleeding: Secondary | ICD-10-CM | POA: Diagnosis not present

## 2019-06-22 DIAGNOSIS — M6289 Other specified disorders of muscle: Secondary | ICD-10-CM | POA: Diagnosis not present

## 2019-06-22 DIAGNOSIS — R350 Frequency of micturition: Secondary | ICD-10-CM | POA: Diagnosis not present

## 2019-06-22 DIAGNOSIS — M6281 Muscle weakness (generalized): Secondary | ICD-10-CM | POA: Diagnosis not present

## 2019-06-22 DIAGNOSIS — R3912 Poor urinary stream: Secondary | ICD-10-CM | POA: Diagnosis not present

## 2019-06-22 DIAGNOSIS — R351 Nocturia: Secondary | ICD-10-CM | POA: Diagnosis not present

## 2019-06-22 DIAGNOSIS — M62838 Other muscle spasm: Secondary | ICD-10-CM | POA: Diagnosis not present

## 2019-07-21 DIAGNOSIS — I82492 Acute embolism and thrombosis of other specified deep vein of left lower extremity: Secondary | ICD-10-CM | POA: Diagnosis not present

## 2019-07-21 DIAGNOSIS — I10 Essential (primary) hypertension: Secondary | ICD-10-CM | POA: Diagnosis not present

## 2019-07-21 DIAGNOSIS — R350 Frequency of micturition: Secondary | ICD-10-CM | POA: Diagnosis not present

## 2019-07-21 DIAGNOSIS — R351 Nocturia: Secondary | ICD-10-CM | POA: Diagnosis not present

## 2019-07-21 DIAGNOSIS — E7800 Pure hypercholesterolemia, unspecified: Secondary | ICD-10-CM | POA: Diagnosis not present

## 2019-07-21 DIAGNOSIS — C61 Malignant neoplasm of prostate: Secondary | ICD-10-CM | POA: Diagnosis not present

## 2019-07-21 DIAGNOSIS — M62838 Other muscle spasm: Secondary | ICD-10-CM | POA: Diagnosis not present

## 2019-07-21 DIAGNOSIS — N401 Enlarged prostate with lower urinary tract symptoms: Secondary | ICD-10-CM | POA: Diagnosis not present

## 2019-07-21 DIAGNOSIS — M6281 Muscle weakness (generalized): Secondary | ICD-10-CM | POA: Diagnosis not present

## 2019-07-21 DIAGNOSIS — R3121 Asymptomatic microscopic hematuria: Secondary | ICD-10-CM | POA: Diagnosis not present

## 2019-07-21 DIAGNOSIS — R3912 Poor urinary stream: Secondary | ICD-10-CM | POA: Diagnosis not present

## 2019-07-21 DIAGNOSIS — R972 Elevated prostate specific antigen [PSA]: Secondary | ICD-10-CM | POA: Diagnosis not present

## 2019-08-09 ENCOUNTER — Other Ambulatory Visit: Payer: Self-pay | Admitting: Urology

## 2019-08-09 DIAGNOSIS — C61 Malignant neoplasm of prostate: Secondary | ICD-10-CM | POA: Diagnosis not present

## 2019-08-09 DIAGNOSIS — R3121 Asymptomatic microscopic hematuria: Secondary | ICD-10-CM | POA: Diagnosis not present

## 2019-08-16 DIAGNOSIS — Z7901 Long term (current) use of anticoagulants: Secondary | ICD-10-CM | POA: Diagnosis not present

## 2019-08-16 DIAGNOSIS — E7800 Pure hypercholesterolemia, unspecified: Secondary | ICD-10-CM | POA: Diagnosis not present

## 2019-08-16 DIAGNOSIS — Z01818 Encounter for other preprocedural examination: Secondary | ICD-10-CM | POA: Diagnosis not present

## 2019-09-01 ENCOUNTER — Other Ambulatory Visit: Payer: Self-pay

## 2019-09-01 ENCOUNTER — Encounter (HOSPITAL_BASED_OUTPATIENT_CLINIC_OR_DEPARTMENT_OTHER): Payer: Self-pay | Admitting: Urology

## 2019-09-01 NOTE — Progress Notes (Addendum)
Spoke w/ via phone for pre-op interview---PT Lab needs dos----  none             COVID test ------09-08-2019 at 1510 Arrive at -------800 am 09-12-2019 NPO after MN NO Solid Food.  Clear liquids from MN until---700 am, then npo, pt wishes to be npo after midnight Medications to take morning of surgery -----none Diabetic medication -----n/a Patient Special Instructions -----none Pre-Op special Istructions -----none Patient verbalized understanding of instructions that were given at this phone interview. Patient denies shortness of breath, chest pain, fever, cough at this phone interview.  Anesthesia Review :hx of right bundle branch block on ekg , left knee dvt 05-03-19, has instructions to stop xarelto x 3 days before surgery per dr [name] note on chart, pt denies any cardiac S & S or sob at pre op phone call    PCP: dr [name] medical clearance note  Cardiologist :dr Priscille Heidelberg cleaver np 05-11-2019 for right bbb on ekg and hyperlipidemia Chest x-ray :none EKG :most recent 08-16-2019 dr [name] on chart Echo :none Stress test:none Cardiac Cath : none Activity level: able to climb steps without difficulty, does house and yard work Sleep Study/ CPAP :n/a Fasting Blood Sugar :      / Checks Blood Sugar -- times a day:  n/a Blood Thinner/ Instructions /Last Dose: has note on chart to stop eliquis 3 days before surgery  from dr [name] dated 08-09-2019 on chart ASA / Instructions/ Last Dose :

## 2019-09-08 ENCOUNTER — Other Ambulatory Visit (HOSPITAL_COMMUNITY)
Admission: RE | Admit: 2019-09-08 | Discharge: 2019-09-08 | Disposition: A | Discharge status: Home / Self Care | Payer: PPO | Source: Ambulatory Visit | Attending: Urology | Admitting: Urology

## 2019-09-08 DIAGNOSIS — Z20822 Contact with and (suspected) exposure to covid-19: Secondary | ICD-10-CM | POA: Diagnosis not present

## 2019-09-08 DIAGNOSIS — Z01812 Encounter for preprocedural laboratory examination: Secondary | ICD-10-CM | POA: Insufficient documentation

## 2019-09-08 LAB — SARS CORONAVIRUS 2 (TAT 6-24 HRS): SARS Coronavirus 2: NEGATIVE

## 2019-09-12 ENCOUNTER — Encounter (HOSPITAL_BASED_OUTPATIENT_CLINIC_OR_DEPARTMENT_OTHER)
Admission: RE | Disposition: A | Discharge status: Home / Self Care | Payer: Self-pay | Source: Home / Self Care | Attending: Urology

## 2019-09-12 ENCOUNTER — Other Ambulatory Visit: Payer: Self-pay

## 2019-09-12 ENCOUNTER — Ambulatory Visit (HOSPITAL_BASED_OUTPATIENT_CLINIC_OR_DEPARTMENT_OTHER): Payer: PPO | Admitting: Anesthesiology

## 2019-09-12 ENCOUNTER — Ambulatory Visit (HOSPITAL_BASED_OUTPATIENT_CLINIC_OR_DEPARTMENT_OTHER)
Admission: RE | Admit: 2019-09-12 | Discharge: 2019-09-12 | Disposition: A | Discharge status: Home / Self Care | Payer: PPO | Attending: Urology | Admitting: Urology

## 2019-09-12 ENCOUNTER — Encounter (HOSPITAL_BASED_OUTPATIENT_CLINIC_OR_DEPARTMENT_OTHER): Payer: Self-pay | Admitting: Urology

## 2019-09-12 DIAGNOSIS — C61 Malignant neoplasm of prostate: Secondary | ICD-10-CM | POA: Insufficient documentation

## 2019-09-12 DIAGNOSIS — E785 Hyperlipidemia, unspecified: Secondary | ICD-10-CM | POA: Diagnosis not present

## 2019-09-12 DIAGNOSIS — R3129 Other microscopic hematuria: Secondary | ICD-10-CM | POA: Diagnosis not present

## 2019-09-12 DIAGNOSIS — Z79899 Other long term (current) drug therapy: Secondary | ICD-10-CM | POA: Insufficient documentation

## 2019-09-12 DIAGNOSIS — Z87891 Personal history of nicotine dependence: Secondary | ICD-10-CM | POA: Diagnosis not present

## 2019-09-12 DIAGNOSIS — M1712 Unilateral primary osteoarthritis, left knee: Secondary | ICD-10-CM | POA: Diagnosis not present

## 2019-09-12 DIAGNOSIS — I4510 Unspecified right bundle-branch block: Secondary | ICD-10-CM | POA: Diagnosis not present

## 2019-09-12 DIAGNOSIS — Z7901 Long term (current) use of anticoagulants: Secondary | ICD-10-CM | POA: Insufficient documentation

## 2019-09-12 DIAGNOSIS — R3121 Asymptomatic microscopic hematuria: Secondary | ICD-10-CM | POA: Diagnosis not present

## 2019-09-12 HISTORY — DX: Other specified disorders of kidney and ureter: N28.89

## 2019-09-12 HISTORY — PX: CYSTOSCOPY WITH URETEROSCOPY: SHX5123

## 2019-09-12 SURGERY — CYSTOSCOPY WITH URETEROSCOPY
Anesthesia: General

## 2019-09-12 MED ORDER — PROPOFOL 10 MG/ML IV BOLUS
INTRAVENOUS | Status: DC | PRN
  Administered 2019-09-12: 350 mg via INTRAVENOUS

## 2019-09-12 MED ORDER — IOHEXOL 300 MG/ML  SOLN
INTRAMUSCULAR | Status: DC | PRN
  Administered 2019-09-12: 5 mL via URETHRAL

## 2019-09-12 MED ORDER — LACTATED RINGERS IV SOLN: INTRAVENOUS | Status: DC

## 2019-09-12 MED ORDER — DEXAMETHASONE SODIUM PHOSPHATE 10 MG/ML IJ SOLN
INTRAMUSCULAR | Status: DC | PRN
  Administered 2019-09-12: 10 mg via INTRAVENOUS

## 2019-09-12 MED ORDER — ONDANSETRON HCL 4 MG/2ML IJ SOLN
INTRAMUSCULAR | Status: DC | PRN
  Administered 2019-09-12: 4 mg via INTRAVENOUS

## 2019-09-12 MED ORDER — PROPOFOL 10 MG/ML IV BOLUS
INTRAVENOUS | Status: AC
  Filled 2019-09-12: qty 40

## 2019-09-12 MED ORDER — CEFAZOLIN SODIUM-DEXTROSE 2-4 GM/100ML-% IV SOLN
INTRAVENOUS | Status: AC
  Filled 2019-09-12: qty 100

## 2019-09-12 MED ORDER — LABETALOL HCL 5 MG/ML IV SOLN
5.0000 mg | INTRAVENOUS | Status: AC | PRN
  Administered 2019-09-12 (×2): 5 mg via INTRAVENOUS

## 2019-09-12 MED ORDER — LIDOCAINE 2% (20 MG/ML) 5 ML SYRINGE
INTRAMUSCULAR | Status: DC | PRN
  Administered 2019-09-12: 40 mg via INTRAVENOUS

## 2019-09-12 MED ORDER — ONDANSETRON HCL 4 MG/2ML IJ SOLN
INTRAMUSCULAR | Status: AC
  Filled 2019-09-12: qty 2

## 2019-09-12 MED ORDER — FENTANYL CITRATE (PF) 100 MCG/2ML IJ SOLN
INTRAMUSCULAR | Status: AC
  Filled 2019-09-12: qty 2

## 2019-09-12 MED ORDER — CEFAZOLIN SODIUM-DEXTROSE 2-4 GM/100ML-% IV SOLN
2.0000 g | INTRAVENOUS | Status: AC
  Administered 2019-09-12: 2 g via INTRAVENOUS

## 2019-09-12 MED ORDER — MIDAZOLAM HCL 5 MG/5ML IJ SOLN
INTRAMUSCULAR | Status: DC | PRN
  Administered 2019-09-12: 2 mg via INTRAVENOUS

## 2019-09-12 MED ORDER — HYDROCODONE-ACETAMINOPHEN 5-325 MG PO TABS
1.0000 | ORAL_TABLET | Freq: Once | ORAL | Status: AC
  Administered 2019-09-12: 1 via ORAL

## 2019-09-12 MED ORDER — LIDOCAINE 2% (20 MG/ML) 5 ML SYRINGE
INTRAMUSCULAR | Status: AC
  Filled 2019-09-12: qty 5

## 2019-09-12 MED ORDER — DEXAMETHASONE SODIUM PHOSPHATE 10 MG/ML IJ SOLN
INTRAMUSCULAR | Status: AC
  Filled 2019-09-12: qty 1

## 2019-09-12 MED ORDER — LABETALOL HCL 5 MG/ML IV SOLN
INTRAVENOUS | Status: DC | PRN
  Administered 2019-09-12 (×2): 5 mg via INTRAVENOUS

## 2019-09-12 MED ORDER — EPHEDRINE 5 MG/ML INJ
INTRAVENOUS | Status: AC
  Filled 2019-09-12: qty 10

## 2019-09-12 MED ORDER — FENTANYL CITRATE (PF) 100 MCG/2ML IJ SOLN
INTRAMUSCULAR | Status: DC | PRN
  Administered 2019-09-12 (×3): 50 ug via INTRAVENOUS

## 2019-09-12 MED ORDER — FENTANYL CITRATE (PF) 100 MCG/2ML IJ SOLN
25.0000 ug | INTRAMUSCULAR | Status: DC | PRN
  Administered 2019-09-12 (×4): 25 ug via INTRAVENOUS

## 2019-09-12 MED ORDER — MIDAZOLAM HCL 2 MG/2ML IJ SOLN: 0.5000 mg | Freq: Once | INTRAMUSCULAR | Status: DC | PRN

## 2019-09-12 MED ORDER — HYDROCODONE-ACETAMINOPHEN 5-325 MG PO TABS
ORAL_TABLET | ORAL | Status: AC
  Filled 2019-09-12: qty 1

## 2019-09-12 MED ORDER — HYDROCODONE-ACETAMINOPHEN 5-325 MG PO TABS: 1.0000 | ORAL_TABLET | ORAL | 0 refills | Status: DC | PRN

## 2019-09-12 MED ORDER — EPHEDRINE SULFATE-NACL 50-0.9 MG/10ML-% IV SOSY
PREFILLED_SYRINGE | INTRAVENOUS | Status: DC | PRN
  Administered 2019-09-12: 15 mg via INTRAVENOUS

## 2019-09-12 MED ORDER — PROMETHAZINE HCL 25 MG/ML IJ SOLN: 6.2500 mg | INTRAMUSCULAR | Status: DC | PRN

## 2019-09-12 MED ORDER — MIDAZOLAM HCL 2 MG/2ML IJ SOLN
INTRAMUSCULAR | Status: AC
  Filled 2019-09-12: qty 2

## 2019-09-12 MED ORDER — MEPERIDINE HCL 25 MG/ML IJ SOLN: 6.2500 mg | INTRAMUSCULAR | Status: DC | PRN

## 2019-09-12 SURGICAL SUPPLY — 32 items
BAG DRAIN URO-CYSTO SKYTR STRL (DRAIN) ×4 IMPLANT
BAG DRN RND TRDRP ANRFLXCHMBR (UROLOGICAL SUPPLIES)
BAG DRN UROCATH (DRAIN) ×2
BAG URINE DRAIN 2000ML AR STRL (UROLOGICAL SUPPLIES) IMPLANT
BAG URINE LEG 500ML (DRAIN) IMPLANT
CATH FOLEY 2WAY SLVR  5CC 22FR (CATHETERS)
CATH FOLEY 2WAY SLVR 30CC 20FR (CATHETERS) IMPLANT
CATH FOLEY 2WAY SLVR 5CC 22FR (CATHETERS) IMPLANT
CATH INTERMIT  6FR 70CM (CATHETERS) IMPLANT
CLOTH BEACON ORANGE TIMEOUT ST (SAFETY) ×4 IMPLANT
ELECT REM PT RETURN 9FT ADLT (ELECTROSURGICAL)
ELECTRODE REM PT RTRN 9FT ADLT (ELECTROSURGICAL) IMPLANT
EVACUATOR MICROVAS BLADDER (UROLOGICAL SUPPLIES) IMPLANT
FIBER LASER FLEXIVA 365 (UROLOGICAL SUPPLIES) IMPLANT
FIBER LASER TRAC TIP (UROLOGICAL SUPPLIES) IMPLANT
GLOVE BIO SURGEON STRL SZ7.5 (GLOVE) ×4 IMPLANT
GOWN STRL REUS W/ TWL LRG LVL3 (GOWN DISPOSABLE) ×2 IMPLANT
GOWN STRL REUS W/TWL LRG LVL3 (GOWN DISPOSABLE) ×4
GOWN STRL REUS W/TWL XL LVL3 (GOWN DISPOSABLE) IMPLANT
GUIDEWIRE STR DUAL SENSOR (WIRE) ×8 IMPLANT
IV NS IRRIG 3000ML ARTHROMATIC (IV SOLUTION) ×4 IMPLANT
KIT TURNOVER CYSTO (KITS) ×4 IMPLANT
MANIFOLD NEPTUNE II (INSTRUMENTS) ×4 IMPLANT
NS IRRIG 500ML POUR BTL (IV SOLUTION) ×4 IMPLANT
PACK CYSTO (CUSTOM PROCEDURE TRAY) ×4 IMPLANT
SHEATH URET ACCESS 12FR/35CM (UROLOGICAL SUPPLIES) ×4 IMPLANT
STENT URET 6FRX26 CONTOUR (STENTS) ×4 IMPLANT
SYR TOOMEY IRRIG 70ML (MISCELLANEOUS)
SYRINGE TOOMEY IRRIG 70ML (MISCELLANEOUS) IMPLANT
TUBE CONNECTING 12'X1/4 (SUCTIONS)
TUBE CONNECTING 12X1/4 (SUCTIONS) IMPLANT
TUBING UROLOGY SET (TUBING) IMPLANT

## 2019-09-12 NOTE — Discharge Instructions (Addendum)
[facility] [phone] Post Ureteroscopy With or Without Stent Instructions  Definitions:  Ureter: The duct that transports urine from the kidney to the bladder. Stent:   A plastic hollow tube that is placed into the ureter, from the kidney to the                 bladder to prevent the ureter from swelling shut.  GENERAL INSTRUCTIONS:  Despite the fact that no skin incisions were used, the area around the ureter and bladder is raw and irritated. The stent is a foreign body which will further irritate the bladder wall. This irritation is manifested by increased frequency of urination, both day and night, and by an increase in the urge to urinate. In some, the urge to urinate is present almost always. Sometimes the urge is strong enough that you may not be able to stop yourself from urinating. The only real cure is to remove the stent and then give time for the bladder wall to heal which can't be done until the danger of the ureter swelling shut has passed, which varies.  You may see some blood in your urine while the stent is in place and a few days afterwards. Do not be alarmed, even if the urine was clear for a while. Get off your feet and drink lots of fluids until clearing occurs. If you start to pass clots or don't improve, call us.  DIET: You may return to your normal diet immediately. Because of the raw surface of your bladder, alcohol, spicy foods, acid type foods and drinks with caffeine may cause irritation or frequency and should be used in moderation. To keep your urine flowing freely and to avoid constipation, drink plenty of fluids during the day ( 8-10 glasses ). Tip: Avoid cranberry juice because it is very acidic.  ACTIVITY: Your physical activity doesn't need to be restricted. However, if you are very active, you may see some blood in your urine. We suggest that you reduce your activity under these circumstances until the bleeding has stopped.  BOWELS: It is  important to keep your bowels regular during the postoperative period. Straining with bowel movements can cause bleeding. A bowel movement every other day is reasonable. Use a mild laxative if needed, such as Milk of Magnesia 2-3 tablespoons, or 2 Dulcolax tablets. Call if you continue to have problems. If you have been taking narcotics for pain, before, during or after your surgery, you may be constipated. Take a laxative if necessary.   MEDICATION: You should resume your pre-surgery medications unless told not to. You may take oxybutynin or flomax if prescribed for bladder spasms or discomfort from the stent Take pain medication as directed for pain refractory to conservative management  PROBLEMS YOU SHOULD REPORT TO US: Fevers over 100.5 Fahrenheit. Heavy bleeding, or clots ( See above notes about blood in urine ). Inability to urinate. Drug reactions ( hives, rash, nausea, vomiting, diarrhea ). Severe burning or pain with urination that is not improving.   Post Anesthesia Home Care Instructions  Activity: Get plenty of rest for the remainder of the day. A responsible individual must stay with you for 24 hours following the procedure.  For the next 24 hours, DO NOT: -Drive a car -Operate machinery -Drink alcoholic beverages -Take any medication unless instructed by your physician -Make any legal decisions or sign important papers.  Meals: Start with liquid foods such as gelatin or soup. Progress to regular foods as tolerated. Avoid greasy, spicy,   heavy foods. If nausea and/or vomiting occur, drink only clear liquids until the nausea and/or vomiting subsides. Call your physician if vomiting continues.  Special Instructions/Symptoms: Your throat may feel dry or sore from the anesthesia or the breathing tube placed in your throat during surgery. If this causes discomfort, gargle with warm salt water. The discomfort should disappear within 24 hours.  

## 2019-09-12 NOTE — Anesthesia Preprocedure Evaluation (Addendum)
Anesthesia Evaluation  Patient identified by MRN, date of birth, ID band Patient awake    Reviewed: Allergy & Precautions, NPO status , Patient's Chart, lab work & pertinent test results  History of Anesthesia Complications Negative for: history of anesthetic complications  Airway Mallampati: II  TM Distance: >3 FB Neck ROM: Full    Dental  (+) Teeth Intact, Dental Advisory Given   Pulmonary former smoker (recently quit),  09/08/2019 SARS coronavirus NEG   breath sounds clear to auscultation       Cardiovascular (-) angina+ DVT (04/2019)   Rhythm:Regular Rate:Normal  01/2019 ECHO: EF 55-60%, valves normal   Neuro/Psych negative neurological ROS     GI/Hepatic negative GI ROS, Neg liver ROS,   Endo/Other  negative endocrine ROS  Renal/GU negative Renal ROS   Ureteral mass, bladder tumor    Musculoskeletal  (+) Arthritis , Osteoarthritis,    Abdominal   Peds  Hematology  (+) Blood dyscrasia (xarelto: last dose 8/12), ,   Anesthesia Other Findings   Reproductive/Obstetrics                            Anesthesia Physical Anesthesia Plan  ASA: III  Anesthesia Plan: General   Post-op Pain Management:    Induction: Intravenous  PONV Risk Score and Plan: 2 and Ondansetron and Dexamethasone  Airway Management Planned: LMA  Additional Equipment: None  Intra-op Plan:   Post-operative Plan:   Informed Consent: I have reviewed the patients History and Physical, chart, labs and discussed the procedure including the risks, benefits and alternatives for the proposed anesthesia with the patient or authorized representative who has indicated his/her understanding and acceptance.     Dental advisory given  Plan Discussed with: CRNA and Surgeon  Anesthesia Plan Comments:        Anesthesia Quick Evaluation

## 2019-09-12 NOTE — Anesthesia Postprocedure Evaluation (Signed)
Anesthesia Post Note  Patient: Jeremiah Richards  Procedure(s) Performed: CYSTOSCOPY WITH URETEROSCOPY  LEFT RETROGRADE  STENT PLACEMENT (Left )     Patient location during evaluation: PACU Anesthesia Type: General Level of consciousness: sedated, patient cooperative and oriented Pain management: pain level controlled Vital Signs Assessment: post-procedure vital signs reviewed and stable Respiratory status: spontaneous breathing, nonlabored ventilation and respiratory function stable Cardiovascular status: blood pressure returned to baseline and stable Postop Assessment: no apparent nausea or vomiting Anesthetic complications: no   No complications documented.  Last Vitals:  Vitals:   09/12/19 1315 09/12/19 1343  BP: (!) 148/90 (!) 189/117  Pulse: 62 62  Resp: 16 16  Temp:    SpO2:  98%    Last Pain:  Vitals:   09/12/19 1230  TempSrc:   PainSc: 5                  Kolbi Tofte,E. Shavell Nored

## 2019-09-12 NOTE — Op Note (Signed)
Operative Note  Preoperative diagnosis:  1.  Microscopic hematuria with possible left renal pelvic filling defect/tumor  Postoperative diagnosis: 1.  Microscopic hematuria  Procedure(s): 1.  Cystoscopy with left retrograde pyelogram, left diagnostic ureteroscopy with ureteral stent placement  Surgeon: Link Snuffer, MD  Assistants: None  Anesthesia: General  Complications: None immediate  EBL: Minimal  Specimens: 1.  None  Drains/Catheters: 1.  6 x 26 double-J ureteral stent  Intraoperative findings: 1.  Normal urethra 2.  Normal bladder mucosa 3.  Retrograde pyelogram without hydronephrosis or filling defect 4.  Diagnostic ureteroscopy did not reveal any mass lesion.  Indication: 64 year old male with microscopic hematuria underwent a hematuria work-up that revealed a possible filling defect in the left renal pelvis/proximal ureter.  He presents for diagnostic ureteroscopy  Description of procedure:  The patient was identified and consent was obtained.  The patient was taken to the operating room and placed in the supine position.  The patient was placed under general anesthesia.  Perioperative antibiotics were administered.  The patient was placed in dorsal lithotomy.  Patient was prepped and draped in a standard sterile fashion and a timeout was performed.  A 21 French rigid cystoscope was advanced into the urethra and into the bladder.  Complete cystoscopy was performed with no tumors found.  The left ureter was cannulated with a sensor wire which was advanced up to the kidney under fluoroscopic guidance.  A semirigid ureteroscope was advanced alongside the wire up the ureter and up to the renal pelvis.  There was no mass lesion.  I shot a retrograde pyelogram through the scope with findings noted above.  I introduced a wire through the scope and into the kidney and withdrew the scope.  I secured one the wires to the drape and the other wire was used to advance a 12 x 14 ureteral  access sheath up the ureter under continuous fluoroscopic guidance up to the proximal ureter.  The inner sheath and the wire were withdrawn.  Digital ureteroscopy was performed and complete pyeloscopy was performed.  No mass lesion was seen.  There was no abnormalities.  No stones.  I then removed the scope along with the access sheath.  There was no significant ureteral trauma and no tumors or calculi were seen again.  I backloaded the wire into a rigid cystoscope and advanced that into the bladder followed by routine placement of a 6 x 26 double-J ureteral stent.  Fluoroscopy confirmed proximal placement and direct visualization confirmed a good coil within the bladder.  I drained the bladder and withdrew the scope.  Patient tolerated the procedure well and was stable postoperative.  Plan: Follow-up in 1 week for stent removal and scheduling for robotic prostatectomy.

## 2019-09-12 NOTE — Anesthesia Procedure Notes (Signed)
Procedure Name: LMA Insertion Date/Time: 09/12/2019 10:12 AM Performed by: Bonney Aid, CRNA Pre-anesthesia Checklist: Patient identified, Emergency Drugs available, Suction available and Patient being monitored Patient Re-evaluated:Patient Re-evaluated prior to induction Oxygen Delivery Method: Circle system utilized Preoxygenation: Pre-oxygenation with 100% oxygen Induction Type: IV induction Ventilation: Mask ventilation without difficulty LMA: LMA inserted LMA Size: 5.0 Number of attempts: 1 Airway Equipment and Method: Bite block Placement Confirmation: positive ETCO2 Tube secured with: Tape Dental Injury: Teeth and Oropharynx as per pre-operative assessment

## 2019-09-12 NOTE — Transfer of Care (Signed)
Immediate Anesthesia Transfer of Care Note  Patient: Jeremiah Richards  Procedure(s) Performed: CYSTOSCOPY WITH URETEROSCOPY  LEFT RETROGRADE  STENT PLACEMENT (Left )  Patient Location: PACU  Anesthesia Type:General  Level of Consciousness: sedated  Airway & Oxygen Therapy: Patient Spontanous Breathing and Patient connected to nasal cannula oxygen  Post-op Assessment: Report given to RN  Post vital signs: Reviewed and stable,  Treated with Labetol 5mg .  Last Vitals:  Vitals Value Taken Time  BP 168/113 09/12/19 1057  Temp    Pulse 70 09/12/19 1059  Resp 15 09/12/19 1059  SpO2 95 % 09/12/19 1059  Vitals shown include unvalidated device data.  Last Pain:  Vitals:   09/12/19 0828  TempSrc: Oral  PainSc: 0-No pain         Complications: No complications documented.

## 2019-09-12 NOTE — H&P (Signed)
H&P  Chief Complaint: Left ureteral filling defect, microscopic hematuria  History of Present Illness: 64 year old male recently diagnosed with prostate cancer.  During follow-ups, he was noted to have persistent microscopic hematuria.  CT IVP revealed a possible filling defect in the left ureteropelvic junction/proximal ureter.  He presents for diagnostic ureteroscopy.  Past Medical History:  Diagnosis Date  . BBB (bundle branch block) 02/04/2019   hx rbbb saw br berry jesse cleaver np for 4-14- 2021  . Blood dyscrasia 04/2019   left knee clot  . Hyperlipidemia   . OA (osteoarthritis)    left knee  . Prostate cancer ([place]) dx 2021  . Ureteral mass    left   Past Surgical History:  Procedure Laterality Date  . APPENDECTOMY  as child  . HAND SURGERY Right as child  . KNEE SURGERY Left    after snow skiing accident age 39    Home Medications:  Medications Prior to Admission  Medication Sig Dispense Refill Last Dose  . atorvastatin (LIPITOR) 40 MG tablet Take 1 tablet (40 mg total) by mouth daily. (Patient taking differently: Take 40 mg by mouth daily. 3 pm) 90 tablet 3 09/11/2019 at Unknown time  . Multiple Vitamins-Minerals (MULTIVITAMIN WITH MINERALS) tablet Take 1 tablet by mouth daily.   Past Month at Unknown time  . XARELTO 15 MG TABS tablet Take 20 mg by mouth daily.    09/08/2019 at Unknown time  . ZINC-VITAMIN C PO Take 1 tablet by mouth daily.   Past Month at Unknown time   Allergies: No Known Allergies  History reviewed. No pertinent family history. Social History:  reports that he quit smoking about 2 weeks ago. His smoking use included cigarettes. He has a 46.00 pack-year smoking history. He has never used smokeless tobacco. He reports previous alcohol use. He reports current drug use. Drug: Marijuana.  ROS: A complete review of systems was performed.  All systems are negative except for pertinent findings as noted. ROS   Physical Exam:  Vital signs in last 24  hours: Temp:  [98.2 F (36.8 C)] 98.2 F (36.8 C) (08/16 0828) Pulse Rate:  [67] 67 (08/16 0828) Resp:  [18] 18 (08/16 0828) BP: (131)/(89) 131/89 (08/16 0828) SpO2:  [98 %] 98 % (08/16 0828) Weight:  [102.1 kg] 102.1 kg (08/16 0849) General:  Alert and oriented, No acute distress HEENT: Normocephalic, atraumatic Neck: No JVD or lymphadenopathy Cardiovascular: Regular rate and rhythm Lungs: Regular rate and effort Abdomen: Soft, nontender, nondistended, no abdominal masses Back: No CVA tenderness Extremities: No edema Neurologic: Grossly intact  Laboratory Data:  No results found for this or any previous visit (from the past 24 hour(s)). Recent Results (from the past 240 hour(s))  SARS CORONAVIRUS 2 (TAT 6-24 HRS) Nasopharyngeal Nasopharyngeal Swab     Status: None   Collection Time: 09/08/19  3:11 PM   Specimen: Nasopharyngeal Swab  Result Value Ref Range Status   SARS Coronavirus 2 NEGATIVE NEGATIVE Final    Comment: (NOTE) SARS-CoV-2 target nucleic acids are NOT DETECTED.  The SARS-CoV-2 RNA is generally detectable in upper and lower respiratory specimens during the acute phase of infection. Negative results do not preclude SARS-CoV-2 infection, do not rule out co-infections with other pathogens, and should not be used as the sole basis for treatment or other patient management decisions. Negative results must be combined with clinical observations, patient history, and epidemiological information. The expected result is Negative.  Fact Sheet for Patients: SugarRoll.be  Fact Sheet for Healthcare  Providers: https://www.woods-mathews.com/  This test is not yet approved or cleared by the [place] and  has been authorized for detection and/or diagnosis of SARS-CoV-2 by FDA under an Emergency Use Authorization (EUA). This EUA will remain  in effect (meaning this test can be used) for the duration of the COVID-19  declaration under Se ction 564(b)(1) of the Act, 21 U.S.C. section 360bbb-3(b)(1), unless the authorization is terminated or revoked sooner.  Performed at [facility], [place] [address], [place], [place] [address]    Creatinine: No results for input(s): CREATININE in the last 168 hours.  Impression/Assessment:  Microscopic hematuria Left ureteral filling defect, possible tumor  Plan:  Plan for cystoscopy with left ureteroscopy, possible laser ablation, possible ureteral stent, possible biopsy.  Risk and benefits discussed.  Marton Redwood, III 09/12/2019, 9:46 AM

## 2019-09-14 ENCOUNTER — Encounter (HOSPITAL_BASED_OUTPATIENT_CLINIC_OR_DEPARTMENT_OTHER): Payer: Self-pay | Admitting: Urology

## 2019-09-14 DIAGNOSIS — K5900 Constipation, unspecified: Secondary | ICD-10-CM | POA: Diagnosis not present

## 2019-09-20 DIAGNOSIS — N23 Unspecified renal colic: Secondary | ICD-10-CM | POA: Diagnosis not present

## 2019-09-20 DIAGNOSIS — R3121 Asymptomatic microscopic hematuria: Secondary | ICD-10-CM | POA: Diagnosis not present

## 2019-09-21 DIAGNOSIS — N23 Unspecified renal colic: Secondary | ICD-10-CM | POA: Diagnosis not present

## 2019-10-04 ENCOUNTER — Other Ambulatory Visit: Payer: Self-pay | Admitting: Urology

## 2019-10-04 ENCOUNTER — Telehealth: Payer: Self-pay | Admitting: Cardiovascular Disease

## 2019-10-04 NOTE — Telephone Encounter (Signed)
        [facility] Pre-operative Risk Assessment    HEARTCARE STAFF: - Please ensure there is not already an duplicate clearance open for this procedure. - Under Visit Info/Reason for Call, type in Other and utilize the format Clearance MM/DD/YY or Clearance TBD. Do not use dashes or single digits. - If request is for dental extraction, please clarify the # of teeth to be extracted.  Request for surgical clearance:  1. What type of surgery is being performed? Robotic prostatectomy   2. When is this surgery scheduled? 10/24/2019  3. What type of clearance is required (medical clearance vs. Pharmacy clearance to hold med vs. Both)? Both  4. Are there any medications that need to be held prior to surgery and how long? Xarelto, 3 days prior durgery  5. Practice name and name of physician performing surgery? Dr. Gloriann Loan   6. What is the office phone number? [place]   7.   What is the office fax number? [phone]  8.   Anesthesia type (None, local, MAC, general) ? General   Jeremiah Richards 10/04/2019, 4:20 PM  _________________________________________________________________   (provider comments below)

## 2019-10-04 NOTE — Telephone Encounter (Signed)
   Primary Cardiologist: Quay Burow, MD  Chart reviewed and patient contacted by phone today as part of pre-operative protocol coverage. Given past medical history and time since last visit, based on ACC/AHA guidelines, Jeremiah Richards would be at acceptable risk for the planned procedure without further cardiovascular testing.   He has a history of a DVT.  His anticoagulation has been handled by his primary care provider.  He knows to contact Dr [name] about holding his Xarelto pre op.  From a cardiac standpoint point he is an acceptable risk for the proposed procedure without further cardiac work up.  The patient was advised that if he develops new symptoms prior to surgery to contact our office to arrange for a follow-up visit, and he verbalized understanding.  I will route this recommendation to the requesting party via Epic fax function and remove from pre-op pool.  Please call with questions.  Kerin Ransom, PA-C 10/04/2019, 4:42 PM

## 2019-10-10 DIAGNOSIS — C61 Malignant neoplasm of prostate: Secondary | ICD-10-CM | POA: Diagnosis not present

## 2019-10-18 NOTE — Patient Instructions (Addendum)
DUE TO COVID-19 ONLY ONE VISITOR IS ALLOWED TO COME WITH YOU AND STAY IN THE WAITING ROOM ONLY DURING PRE OP AND PROCEDURE DAY OF SURGERY. THE 1 VISITOR  MAY VISIT WITH YOU AFTER SURGERY IN YOUR PRIVATE ROOM DURING VISITING HOURS ONLY!  YOU NEED TO HAVE A COVID 19 TEST ON__9/23_____ @__3 :00pm_____, THIS TEST MUST BE DONE BEFORE SURGERY,  COVID TESTING SITE [place] [address], IT IS ON THE RIGHT GOING OUT [address] APPROXIMATELY  2 MINUTES PAST [employer] ON THE RIGHT. ONCE YOUR COVID TEST IS COMPLETED,  PLEASE BEGIN THE QUARANTINE INSTRUCTIONS AS OUTLINED IN YOUR HANDOUT.                [place]    Your procedure is scheduled on: 10/24/19   Report to [facility] Main  Entrance   Report to admitting at   10:15 AM     Call this number if you have problems the morning of surgery [phone]    Remember: Do not eat food or drink liquids :After Midnight.   BRUSH YOUR TEETH MORNING OF SURGERY AND RINSE YOUR MOUTH OUT, NO CHEWING GUM CANDY OR MINTS.     Take these medicines the morning of surgery with A SIP OF WATER: none                                 You may not have any metal on your body including               piercings  Do not wear jewelry, lotions, powders or deodorant                       Men may shave face and neck.   Do not bring valuables to the hospital. [place].  Contacts, dentures or bridgework may not be worn into surgery.      Special Instructions: N/A              Please read over the following fact sheets you were given: _____________________________________________________________________             [facility] - Preparing for Surgery Before surgery, you can play an important role.   Because skin is not sterile, your skin needs to be as free of germs as possible.   You can reduce the number of germs on your skin by washing with CHG (chlorahexidine  gluconate) soap before surgery.   CHG is an antiseptic cleaner which kills germs and bonds with the skin to continue killing germs even after washing. Please DO NOT use if you have an allergy to CHG or antibacterial soaps.   If your skin becomes reddened/irritated stop using the CHG and inform your nurse when you arrive at Short Stay.   You may shave your face/neck.  Please follow these instructions carefully:  1.  Shower with CHG Soap the night before surgery and the  morning of Surgery.  2.  If you choose to wash your hair, wash your hair first as usual with your  normal  shampoo.  3.  After you shampoo, rinse your hair and body thoroughly to remove the  shampoo.  4.  Use CHG as you would any other liquid soap.  You can apply chg directly  to the skin and wash                       Gently with a scrungie or clean washcloth.  5.  Apply the CHG Soap to your body ONLY FROM THE NECK DOWN.   Do not use on face/ open                           Wound or open sores. Avoid contact with eyes, ears mouth and genitals (private parts).                       Wash face,  Genitals (private parts) with your normal soap.             6.  Wash thoroughly, paying special attention to the area where your surgery  will be performed.  7.  Thoroughly rinse your body with warm water from the neck down.  8.  DO NOT shower/wash with your normal soap after using and rinsing off  the CHG Soap.             9.  Pat yourself dry with a clean towel.            10.  Wear clean pajamas.            11.  Place clean sheets on your bed the night of your first shower and do not  sleep with pets. Day of Surgery : Do not apply any lotions/deodorants the morning of surgery.  Please wear clean clothes to the hospital/surgery center.  FAILURE TO FOLLOW THESE INSTRUCTIONS MAY RESULT IN THE CANCELLATION OF YOUR SURGERY PATIENT SIGNATURE_________________________________  NURSE  SIGNATURE__________________________________  ________________________________________________________________________

## 2019-10-19 ENCOUNTER — Encounter (HOSPITAL_COMMUNITY): Payer: Self-pay

## 2019-10-19 ENCOUNTER — Encounter (HOSPITAL_COMMUNITY)
Admission: RE | Admit: 2019-10-19 | Discharge: 2019-10-19 | Disposition: A | Discharge status: Home / Self Care | Payer: PPO | Source: Ambulatory Visit | Attending: Urology | Admitting: Urology

## 2019-10-19 ENCOUNTER — Other Ambulatory Visit: Payer: Self-pay

## 2019-10-19 DIAGNOSIS — Z01812 Encounter for preprocedural laboratory examination: Secondary | ICD-10-CM | POA: Insufficient documentation

## 2019-10-19 DIAGNOSIS — Z960 Presence of urogenital implants: Secondary | ICD-10-CM | POA: Insufficient documentation

## 2019-10-19 DIAGNOSIS — M6281 Muscle weakness (generalized): Secondary | ICD-10-CM | POA: Diagnosis not present

## 2019-10-19 DIAGNOSIS — I4510 Unspecified right bundle-branch block: Secondary | ICD-10-CM | POA: Diagnosis not present

## 2019-10-19 DIAGNOSIS — E785 Hyperlipidemia, unspecified: Secondary | ICD-10-CM | POA: Insufficient documentation

## 2019-10-19 DIAGNOSIS — Z86718 Personal history of other venous thrombosis and embolism: Secondary | ICD-10-CM | POA: Insufficient documentation

## 2019-10-19 DIAGNOSIS — M1712 Unilateral primary osteoarthritis, left knee: Secondary | ICD-10-CM | POA: Insufficient documentation

## 2019-10-19 DIAGNOSIS — Z7901 Long term (current) use of anticoagulants: Secondary | ICD-10-CM | POA: Diagnosis not present

## 2019-10-19 DIAGNOSIS — R351 Nocturia: Secondary | ICD-10-CM | POA: Diagnosis not present

## 2019-10-19 DIAGNOSIS — Z79899 Other long term (current) drug therapy: Secondary | ICD-10-CM | POA: Insufficient documentation

## 2019-10-19 DIAGNOSIS — I82492 Acute embolism and thrombosis of other specified deep vein of left lower extremity: Secondary | ICD-10-CM | POA: Diagnosis not present

## 2019-10-19 DIAGNOSIS — K5900 Constipation, unspecified: Secondary | ICD-10-CM | POA: Diagnosis not present

## 2019-10-19 DIAGNOSIS — M6289 Other specified disorders of muscle: Secondary | ICD-10-CM | POA: Diagnosis not present

## 2019-10-19 DIAGNOSIS — R3912 Poor urinary stream: Secondary | ICD-10-CM | POA: Diagnosis not present

## 2019-10-19 DIAGNOSIS — R350 Frequency of micturition: Secondary | ICD-10-CM | POA: Diagnosis not present

## 2019-10-19 DIAGNOSIS — C61 Malignant neoplasm of prostate: Secondary | ICD-10-CM | POA: Diagnosis not present

## 2019-10-19 DIAGNOSIS — M62838 Other muscle spasm: Secondary | ICD-10-CM | POA: Diagnosis not present

## 2019-10-19 LAB — BASIC METABOLIC PANEL
Anion gap: 7 (ref 5–15)
BUN: 15 mg/dL (ref 8–23)
CO2: 28 mmol/L (ref 22–32)
Calcium: 9 mg/dL (ref 8.9–10.3)
Chloride: 103 mmol/L (ref 98–111)
Creatinine, Ser: 0.95 mg/dL (ref 0.61–1.24)
GFR calc Af Amer: >60 mL/min (ref >60)
GFR calc non Af Amer: >60 mL/min (ref >60)
Glucose, Bld: 108 mg/dL — ABNORMAL HIGH (ref 70–99)
Potassium: 4.8 mmol/L (ref 3.5–5.1)
Sodium: 138 mmol/L (ref 135–145)

## 2019-10-19 LAB — CBC
HCT: 44.4 % (ref 39.0–52.0)
Hemoglobin: 15 g/dL (ref 13.0–17.0)
MCH: 31.6 pg (ref 26.0–34.0)
MCHC: 33.8 g/dL (ref 30.0–36.0)
MCV: 93.5 fL (ref 80.0–100.0)
Platelets: 190 10*3/uL (ref 150–400)
RBC: 4.75 MIL/uL (ref 4.22–5.81)
RDW: 12.2 % (ref 11.5–15.5)
WBC: 8 10*3/uL (ref 4.0–10.5)
nRBC: 0 % (ref 0.0–0.2)

## 2019-10-19 NOTE — Progress Notes (Signed)
COVID Vaccine Completed:Yes Date COVID Vaccine completed:06/07/19 COVID vaccine manufacturer: Pfizer      PCP - Dr. Cipriano Mile Cardiologist - Dr. Adora Fridge- clearance in Epic 10/04/19  Chest x-ray - no EKG - 09/12/19-Epic Stress Test - no ECHO - 12/12/19-Epic Cardiac Cath - no Pacemaker/ICD device last checked:NA  Sleep Study - no CPAP -   Fasting Blood Sugar - NA Checks Blood Sugar _____ times a day  Blood Thinner Instructions:Xarelto/ Dr. Gwenlyn Found Aspirin Instructions:Stop 3 days prior to DOS/ Gwenlyn Found Last Dose:9/23  Anesthesia review:   Patient denies shortness of breath, fever, cough and chest pain at PAT appointment yes   Patient verbalized understanding of instructions that were given to them at the PAT appointment. Patient was also instructed that they will need to review over the PAT instructions again at home before surgery. Yes Pt has always been an active person. He has no SOB climbing stairs, working or with ADLs  He has a blood clot lt calf

## 2019-10-20 ENCOUNTER — Other Ambulatory Visit (HOSPITAL_COMMUNITY)
Admission: RE | Admit: 2019-10-20 | Discharge: 2019-10-20 | Disposition: A | Discharge status: Home / Self Care | Payer: PPO | Source: Ambulatory Visit | Attending: Urology | Admitting: Urology

## 2019-10-20 DIAGNOSIS — Z20822 Contact with and (suspected) exposure to covid-19: Secondary | ICD-10-CM | POA: Insufficient documentation

## 2019-10-20 DIAGNOSIS — Z01812 Encounter for preprocedural laboratory examination: Secondary | ICD-10-CM | POA: Insufficient documentation

## 2019-10-20 LAB — SARS CORONAVIRUS 2 (TAT 6-24 HRS): SARS Coronavirus 2: NEGATIVE

## 2019-10-20 NOTE — Anesthesia Preprocedure Evaluation (Addendum)
Anesthesia Evaluation  Patient identified by MRN, date of birth, ID band Patient awake    Reviewed: Allergy & Precautions, NPO status , Patient's Chart, lab work & pertinent test results  Airway Mallampati: II  TM Distance: >3 FB Neck ROM: Full    Dental  (+) Dental Advisory Given   Pulmonary neg pulmonary ROS, former smoker,    Pulmonary exam normal breath sounds clear to auscultation       Cardiovascular Normal cardiovascular exam+ dysrhythmias  Rhythm:Regular Rate:Normal     Neuro/Psych negative neurological ROS     GI/Hepatic negative GI ROS, Neg liver ROS,   Endo/Other  negative endocrine ROS  Renal/GU Renal disease     Musculoskeletal  (+) Arthritis ,   Abdominal   Peds  Hematology  (+) Blood dyscrasia, ,   Anesthesia Other Findings   Reproductive/Obstetrics                                                            Anesthesia Evaluation  Patient identified by MRN, date of birth, ID band Patient awake    Reviewed: Allergy & Precautions, NPO status , Patient's Chart, lab work & pertinent test results  History of Anesthesia Complications Negative for: history of anesthetic complications  Airway Mallampati: II  TM Distance: >3 FB Neck ROM: Full    Dental  (+) Teeth Intact, Dental Advisory Given   Pulmonary former smoker (recently quit),  09/08/2019 SARS coronavirus NEG   breath sounds clear to auscultation       Cardiovascular (-) angina+ DVT (04/2019)   Rhythm:Regular Rate:Normal  01/2019 ECHO: EF 55-60%, valves normal   Neuro/Psych negative neurological ROS     GI/Hepatic negative GI ROS, Neg liver ROS,   Endo/Other  negative endocrine ROS  Renal/GU negative Renal ROS   Ureteral mass, bladder tumor    Musculoskeletal  (+) Arthritis , Osteoarthritis,    Abdominal   Peds  Hematology  (+) Blood dyscrasia (xarelto: last dose 8/12), ,    Anesthesia Other Findings   Reproductive/Obstetrics                            Anesthesia Physical Anesthesia Plan  ASA: III  Anesthesia Plan: General   Post-op Pain Management:    Induction: Intravenous  PONV Risk Score and Plan: 2 and Ondansetron and Dexamethasone  Airway Management Planned: LMA  Additional Equipment: None  Intra-op Plan:   Post-operative Plan:   Informed Consent: I have reviewed the patients History and Physical, chart, labs and discussed the procedure including the risks, benefits and alternatives for the proposed anesthesia with the patient or authorized representative who has indicated his/her understanding and acceptance.     Dental advisory given  Plan Discussed with: CRNA and Surgeon  Anesthesia Plan Comments:        Anesthesia Quick Evaluation  Anesthesia Physical Anesthesia Plan  ASA: II  Anesthesia Plan: General   Post-op Pain Management:    Induction: Intravenous  PONV Risk Score and Plan: 4 or greater and Ondansetron, Dexamethasone, Treatment may vary due to age or medical condition and Midazolam  Airway Management Planned: Oral ETT  Additional Equipment: None  Intra-op Plan:   Post-operative Plan: Extubation in OR  Informed Consent: I have reviewed the patients  History and Physical, chart, labs and discussed the procedure including the risks, benefits and alternatives for the proposed anesthesia with the patient or authorized representative who has indicated his/her understanding and acceptance.     Dental advisory given  Plan Discussed with: CRNA  Anesthesia Plan Comments: (2 x PIV  See PAT note 10/19/2019, Konrad Felix, PA-C)       Anesthesia Quick Evaluation

## 2019-10-20 NOTE — Progress Notes (Signed)
Anesthesia Chart Review   Case: [id number] Date/Time: 10/24/19 1200   Procedures:      XI ROBOTIC ASSISTED LAPAROSCOPIC RADICAL PROSTATECTOMY (N/A )     BILATERAL PELVIC LYMPH NODE DISSECTION (Bilateral )   Anesthesia type: General   Pre-op diagnosis: PROSTATE CANCER   Location: [facility] ROOM 03 / [facility]   Surgeons: Lucas Mallow, MD      DISCUSSION:64 y.o. former smoker (quit 08/25/19) with h/o HLD, RBBB, DVT (on Xarelto) prostate cancer scheduled for above procedure 10/24/2019 with Dr. Link Snuffer.   Per cardiology preoperative risk assessment 10/04/2019, "Chart reviewed and patient contacted by phone today as part of pre-operative protocol coverage. Given past medical history and time since last visit, based on ACC/AHA guidelines, Jeremiah Richards would be at acceptable risk for the planned procedure without further cardiovascular testing.  He has a history of a DVT.  His anticoagulation has been handled by his primary care provider.  He knows to contact Dr [name] about holding his Xarelto pre op.  From a cardiac standpoint point he is an acceptable risk for the proposed procedure without further cardiac work up. The patient was advised that if he develops new symptoms prior to surgery to contact our office to arrange for a follow-up visit, and he verbalized understanding."  Advised by PCP to hold Xarelto 3 days prior to surgery.   Anticipate pt can proceed with planned procedure barring acute status change.   VS: BP (!) 151/96   Pulse 73   Temp 36.9 C (Oral)   Resp 18   Ht 6\' 2"  (1.88 m)   Wt 98.1 kg   SpO2 97%   BMI 27.78 kg/m   PROVIDERS: [name], MD is PCP   Quay Burow, MD is Cardiologist  LABS: Labs reviewed: Acceptable for surgery. (all labs ordered are listed, but only abnormal results are displayed)  Labs Reviewed  BASIC METABOLIC PANEL - Abnormal; Notable for the following components:      Result Value   Glucose, Bld 108 (*)    All other components  within normal limits  CBC  TYPE AND SCREEN     IMAGES:   EKG: 09/12/2019 Rate 64 bpm NSR with sinus arrhythmia RBBB LAFB Bifasicular block   CV: Echo 02/11/2019 IMPRESSIONS    1. Left ventricular ejection fraction, by visual estimation, is 55 to  60%. The left ventricle has normal function. There is no left ventricular  hypertrophy.  2. The left ventricle has no regional wall motion abnormalities.  3. Global right ventricle has normal systolic function.The right  ventricular size is normal. No increase in right ventricular wall  thickness.  4. Left atrial size was normal.  5. Right atrial size was normal.  6. The mitral valve is normal in structure. No evidence of mitral valve  regurgitation. No evidence of mitral stenosis.  7. The tricuspid valve is normal in structure.  8. The aortic valve is normal in structure. Aortic valve regurgitation is  not visualized. No evidence of aortic valve sclerosis or stenosis.  9. The pulmonic valve was normal in structure. Pulmonic valve  regurgitation is not visualized.  10. The inferior vena cava is normal in size with greater than 50%  respiratory variability, suggesting right atrial pressure of 3 mmHg. Past Medical History:  Diagnosis Date  . BBB (bundle branch block) 02/04/2019   hx rbbb saw br berry jesse cleaver np for 4-14- 2021  . Blood dyscrasia 04/2019   left knee clot  .  Hyperlipidemia   . OA (osteoarthritis)    left knee  . Prostate cancer ([place]) dx 2021  . Ureteral mass    left benign    Past Surgical History:  Procedure Laterality Date  . APPENDECTOMY  as child  . CYSTOSCOPY WITH URETEROSCOPY Left 09/12/2019   Procedure: CYSTOSCOPY WITH URETEROSCOPY  LEFT RETROGRADE  STENT PLACEMENT;  Surgeon: Lucas Mallow, MD;  Location: [facility];  Service: Urology;  Laterality: Left;  . HAND SURGERY Right as child  . KNEE SURGERY Left    after snow skiing accident age 3    MEDICATIONS: .  acetaminophen (TYLENOL) 500 MG tablet  . atorvastatin (LIPITOR) 40 MG tablet  . HYDROcodone-acetaminophen (NORCO) 5-325 MG tablet  . Multiple Vitamins-Minerals (MULTIVITAMIN WITH MINERALS) tablet  . XARELTO 20 MG TABS tablet   No current facility-administered medications for this encounter.    Konrad Felix, PA-C [facility] [phone]

## 2019-10-24 ENCOUNTER — Observation Stay (HOSPITAL_COMMUNITY)
Admission: RE | Admit: 2019-10-24 | Discharge: 2019-10-25 | Disposition: A | Discharge status: Home / Self Care | Payer: PPO | Source: Ambulatory Visit | Attending: Urology | Admitting: Urology

## 2019-10-24 ENCOUNTER — Ambulatory Visit (HOSPITAL_COMMUNITY): Payer: PPO | Admitting: Certified Registered Nurse Anesthetist

## 2019-10-24 ENCOUNTER — Encounter (HOSPITAL_COMMUNITY)
Admission: RE | Disposition: A | Discharge status: Home / Self Care | Payer: Self-pay | Source: Ambulatory Visit | Attending: Urology

## 2019-10-24 ENCOUNTER — Ambulatory Visit (HOSPITAL_COMMUNITY): Payer: PPO | Admitting: Physician Assistant

## 2019-10-24 ENCOUNTER — Other Ambulatory Visit: Payer: Self-pay

## 2019-10-24 ENCOUNTER — Encounter (HOSPITAL_COMMUNITY): Payer: Self-pay | Admitting: Urology

## 2019-10-24 DIAGNOSIS — C61 Malignant neoplasm of prostate: Principal | ICD-10-CM | POA: Insufficient documentation

## 2019-10-24 DIAGNOSIS — Z87891 Personal history of nicotine dependence: Secondary | ICD-10-CM | POA: Diagnosis not present

## 2019-10-24 DIAGNOSIS — E785 Hyperlipidemia, unspecified: Secondary | ICD-10-CM | POA: Diagnosis not present

## 2019-10-24 DIAGNOSIS — I4510 Unspecified right bundle-branch block: Secondary | ICD-10-CM | POA: Diagnosis not present

## 2019-10-24 HISTORY — PX: PELVIC LYMPH NODE DISSECTION: SHX6543

## 2019-10-24 HISTORY — PX: ROBOT ASSISTED LAPAROSCOPIC RADICAL PROSTATECTOMY: SHX5141

## 2019-10-24 LAB — TYPE AND SCREEN
ABO/RH(D): A POS
Antibody Screen: NEGATIVE

## 2019-10-24 LAB — HEMOGLOBIN AND HEMATOCRIT, BLOOD
HCT: 38.4 % — ABNORMAL LOW (ref 39.0–52.0)
Hemoglobin: 13.2 g/dL (ref 13.0–17.0)

## 2019-10-24 SURGERY — PROSTATECTOMY, RADICAL, ROBOT-ASSISTED, LAPAROSCOPIC
Anesthesia: General | Site: Abdomen

## 2019-10-24 MED ORDER — SODIUM CHLORIDE (PF) 0.9 % IJ SOLN
INTRAMUSCULAR | Status: DC | PRN
  Administered 2019-10-24: 20 mL

## 2019-10-24 MED ORDER — BELLADONNA ALKALOIDS-OPIUM 16.2-60 MG RE SUPP: 1.0000 | Freq: Four times a day (QID) | RECTAL | Status: DC | PRN

## 2019-10-24 MED ORDER — HYDROMORPHONE HCL 1 MG/ML IJ SOLN
INTRAMUSCULAR | Status: AC
  Administered 2019-10-24: 0.5 mg via INTRAVENOUS
  Filled 2019-10-24: qty 1

## 2019-10-24 MED ORDER — PHENYLEPHRINE 40 MCG/ML (10ML) SYRINGE FOR IV PUSH (FOR BLOOD PRESSURE SUPPORT)
PREFILLED_SYRINGE | INTRAVENOUS | Status: DC | PRN
  Administered 2019-10-24: 80 ug via INTRAVENOUS
  Administered 2019-10-24: 100 ug via INTRAVENOUS

## 2019-10-24 MED ORDER — ORAL CARE MOUTH RINSE: 15.0000 mL | Freq: Once | OROMUCOSAL | Status: AC

## 2019-10-24 MED ORDER — PHENYLEPHRINE 40 MCG/ML (10ML) SYRINGE FOR IV PUSH (FOR BLOOD PRESSURE SUPPORT)
PREFILLED_SYRINGE | INTRAVENOUS | Status: AC
  Filled 2019-10-24: qty 10

## 2019-10-24 MED ORDER — ROCURONIUM BROMIDE 10 MG/ML (PF) SYRINGE
PREFILLED_SYRINGE | INTRAVENOUS | Status: DC | PRN
  Administered 2019-10-24: 10 mg via INTRAVENOUS
  Administered 2019-10-24 (×2): 20 mg via INTRAVENOUS
  Administered 2019-10-24: 80 mg via INTRAVENOUS

## 2019-10-24 MED ORDER — DEXAMETHASONE SODIUM PHOSPHATE 10 MG/ML IJ SOLN
INTRAMUSCULAR | Status: DC | PRN
  Administered 2019-10-24: 8 mg via INTRAVENOUS

## 2019-10-24 MED ORDER — BACITRACIN-NEOMYCIN-POLYMYXIN 400-5-5000 EX OINT: 1.0000 "application " | TOPICAL_OINTMENT | Freq: Three times a day (TID) | CUTANEOUS | Status: DC | PRN

## 2019-10-24 MED ORDER — STERILE WATER FOR IRRIGATION IR SOLN
Status: DC | PRN
  Administered 2019-10-24: 1000 mL

## 2019-10-24 MED ORDER — ALBUMIN HUMAN 5 % IV SOLN
INTRAVENOUS | Status: AC
  Filled 2019-10-24: qty 250

## 2019-10-24 MED ORDER — ONDANSETRON HCL 4 MG/2ML IJ SOLN
4.0000 mg | INTRAMUSCULAR | Status: DC | PRN
  Administered 2019-10-24: 4 mg via INTRAVENOUS
  Filled 2019-10-24: qty 2

## 2019-10-24 MED ORDER — DIPHENHYDRAMINE HCL 50 MG/ML IJ SOLN: 12.5000 mg | Freq: Four times a day (QID) | INTRAMUSCULAR | Status: DC | PRN

## 2019-10-24 MED ORDER — CEFAZOLIN SODIUM-DEXTROSE 2-4 GM/100ML-% IV SOLN
2.0000 g | INTRAVENOUS | Status: AC
  Administered 2019-10-24 (×2): 2 g via INTRAVENOUS
  Filled 2019-10-24: qty 100

## 2019-10-24 MED ORDER — SODIUM CHLORIDE 0.9 % IV BOLUS
1000.0000 mL | Freq: Once | INTRAVENOUS | Status: AC
  Administered 2019-10-24: 1000 mL via INTRAVENOUS

## 2019-10-24 MED ORDER — ATORVASTATIN CALCIUM 40 MG PO TABS
40.0000 mg | ORAL_TABLET | Freq: Every day | ORAL | Status: DC
  Administered 2019-10-24 – 2019-10-25 (×2): 40 mg via ORAL
  Filled 2019-10-24 (×2): qty 1

## 2019-10-24 MED ORDER — FENTANYL CITRATE (PF) 250 MCG/5ML IJ SOLN
INTRAMUSCULAR | Status: AC
  Filled 2019-10-24: qty 5

## 2019-10-24 MED ORDER — MIDAZOLAM HCL 2 MG/2ML IJ SOLN
INTRAMUSCULAR | Status: AC
  Filled 2019-10-24: qty 2

## 2019-10-24 MED ORDER — MIDAZOLAM HCL 2 MG/2ML IJ SOLN
INTRAMUSCULAR | Status: DC | PRN
  Administered 2019-10-24: 2 mg via INTRAVENOUS

## 2019-10-24 MED ORDER — DEXAMETHASONE SODIUM PHOSPHATE 10 MG/ML IJ SOLN
INTRAMUSCULAR | Status: AC
  Filled 2019-10-24: qty 1

## 2019-10-24 MED ORDER — FENTANYL CITRATE (PF) 250 MCG/5ML IJ SOLN
INTRAMUSCULAR | Status: DC | PRN
Start: 2019-10-24 — End: 2019-10-24
  Administered 2019-10-24: 50 ug via INTRAVENOUS
  Administered 2019-10-24 (×2): 100 ug via INTRAVENOUS
  Administered 2019-10-24 (×3): 50 ug via INTRAVENOUS
  Administered 2019-10-24: 100 ug via INTRAVENOUS

## 2019-10-24 MED ORDER — SODIUM CHLORIDE (PF) 0.9 % IJ SOLN
INTRAMUSCULAR | Status: AC
  Filled 2019-10-24: qty 20

## 2019-10-24 MED ORDER — SUGAMMADEX SODIUM 200 MG/2ML IV SOLN
INTRAVENOUS | Status: DC | PRN
  Administered 2019-10-24: 200 mg via INTRAVENOUS

## 2019-10-24 MED ORDER — OXYCODONE HCL 5 MG PO TABS
5.0000 mg | ORAL_TABLET | ORAL | Status: DC | PRN
  Administered 2019-10-25: 5 mg via ORAL
  Filled 2019-10-24: qty 1

## 2019-10-24 MED ORDER — CEFAZOLIN SODIUM-DEXTROSE 2-4 GM/100ML-% IV SOLN
INTRAVENOUS | Status: AC
  Filled 2019-10-24: qty 100

## 2019-10-24 MED ORDER — HYDROMORPHONE HCL 1 MG/ML IJ SOLN
0.2500 mg | INTRAMUSCULAR | Status: DC | PRN
  Administered 2019-10-24 (×2): 0.25 mg via INTRAVENOUS

## 2019-10-24 MED ORDER — SULFAMETHOXAZOLE-TRIMETHOPRIM 800-160 MG PO TABS: 1.0000 | ORAL_TABLET | Freq: Two times a day (BID) | ORAL | 0 refills | Status: DC

## 2019-10-24 MED ORDER — ALBUMIN HUMAN 5 % IV SOLN: INTRAVENOUS | Status: DC | PRN

## 2019-10-24 MED ORDER — LIDOCAINE HCL (CARDIAC) PF 100 MG/5ML IV SOSY
PREFILLED_SYRINGE | INTRAVENOUS | Status: DC | PRN
  Administered 2019-10-24: 80 mg via INTRAVENOUS

## 2019-10-24 MED ORDER — LACTATED RINGERS IR SOLN
Status: DC | PRN
  Administered 2019-10-24: 1000 mL

## 2019-10-24 MED ORDER — ROCURONIUM BROMIDE 10 MG/ML (PF) SYRINGE
PREFILLED_SYRINGE | INTRAVENOUS | Status: AC
  Filled 2019-10-24: qty 10

## 2019-10-24 MED ORDER — ONDANSETRON HCL 4 MG/2ML IJ SOLN
INTRAMUSCULAR | Status: AC
  Filled 2019-10-24: qty 2

## 2019-10-24 MED ORDER — LACTATED RINGERS IV SOLN: INTRAVENOUS | Status: DC

## 2019-10-24 MED ORDER — BUPIVACAINE LIPOSOME 1.3 % IJ SUSP
20.0000 mL | Freq: Once | INTRAMUSCULAR | Status: AC
  Administered 2019-10-24: 20 mL
  Filled 2019-10-24: qty 20

## 2019-10-24 MED ORDER — ACETAMINOPHEN 500 MG PO TABS
1000.0000 mg | ORAL_TABLET | Freq: Four times a day (QID) | ORAL | Status: DC
  Administered 2019-10-24 – 2019-10-25 (×3): 1000 mg via ORAL
  Filled 2019-10-24 (×3): qty 2

## 2019-10-24 MED ORDER — HYDROCODONE-ACETAMINOPHEN 5-325 MG PO TABS: 1.0000 | ORAL_TABLET | Freq: Four times a day (QID) | ORAL | 0 refills | Status: DC | PRN

## 2019-10-24 MED ORDER — LIDOCAINE 2% (20 MG/ML) 5 ML SYRINGE
INTRAMUSCULAR | Status: AC
  Filled 2019-10-24: qty 5

## 2019-10-24 MED ORDER — DEXTROSE-NACL 5-0.45 % IV SOLN: INTRAVENOUS | Status: DC

## 2019-10-24 MED ORDER — ONDANSETRON HCL 4 MG/2ML IJ SOLN
INTRAMUSCULAR | Status: DC | PRN
  Administered 2019-10-24: 4 mg via INTRAVENOUS

## 2019-10-24 MED ORDER — DOCUSATE SODIUM 100 MG PO CAPS
100.0000 mg | ORAL_CAPSULE | Freq: Two times a day (BID) | ORAL | Status: DC
  Administered 2019-10-24 – 2019-10-25 (×2): 100 mg via ORAL
  Filled 2019-10-24 (×2): qty 1

## 2019-10-24 MED ORDER — HYDROMORPHONE HCL 1 MG/ML IJ SOLN
0.5000 mg | INTRAMUSCULAR | Status: DC | PRN
  Administered 2019-10-25: 0.5 mg via INTRAVENOUS
  Filled 2019-10-24: qty 1

## 2019-10-24 MED ORDER — PROPOFOL 10 MG/ML IV BOLUS
INTRAVENOUS | Status: DC | PRN
  Administered 2019-10-24: 30 mg via INTRAVENOUS
  Administered 2019-10-24: 150 mg via INTRAVENOUS
  Administered 2019-10-24: 20 mg via INTRAVENOUS

## 2019-10-24 MED ORDER — PROMETHAZINE HCL 25 MG/ML IJ SOLN: 6.2500 mg | INTRAMUSCULAR | Status: DC | PRN

## 2019-10-24 MED ORDER — CHLORHEXIDINE GLUCONATE 0.12 % MT SOLN
15.0000 mL | Freq: Once | OROMUCOSAL | Status: AC
  Administered 2019-10-24: 15 mL via OROMUCOSAL

## 2019-10-24 MED ORDER — DIPHENHYDRAMINE HCL 12.5 MG/5ML PO ELIX: 12.5000 mg | ORAL_SOLUTION | Freq: Four times a day (QID) | ORAL | Status: DC | PRN

## 2019-10-24 MED ORDER — MEPERIDINE HCL 50 MG/ML IJ SOLN: 6.2500 mg | INTRAMUSCULAR | Status: DC | PRN

## 2019-10-24 SURGICAL SUPPLY — 60 items
APPLICATOR ARISTA FLEXITIP XL (MISCELLANEOUS) ×4 IMPLANT
APPLICATOR COTTON TIP 6 STRL (MISCELLANEOUS) ×2 IMPLANT
APPLICATOR COTTON TIP 6IN STRL (MISCELLANEOUS) ×4
APPLICATOR SURGIFLO ENDO (HEMOSTASIS) ×4 IMPLANT
CATH FOLEY 2WAY SLVR  5CC 18FR (CATHETERS) ×4
CATH FOLEY 2WAY SLVR 5CC 18FR (CATHETERS) ×2 IMPLANT
CATH TIEMANN FOLEY 18FR 5CC (CATHETERS) ×4 IMPLANT
CHLORAPREP W/TINT 26 (MISCELLANEOUS) ×4 IMPLANT
CLIP VESOLOCK LG 6/CT PURPLE (CLIP) ×8 IMPLANT
COVER SURGICAL LIGHT HANDLE (MISCELLANEOUS) ×4 IMPLANT
COVER TIP SHEARS 8 DVNC (MISCELLANEOUS) ×2 IMPLANT
COVER TIP SHEARS 8MM DA VINCI (MISCELLANEOUS) ×4
COVER WAND RF STERILE (DRAPES) IMPLANT
CUTTER ECHEON FLEX ENDO 45 340 (ENDOMECHANICALS) ×4 IMPLANT
DECANTER SPIKE VIAL GLASS SM (MISCELLANEOUS) ×4 IMPLANT
DERMABOND ADVANCED (GAUZE/BANDAGES/DRESSINGS) ×2
DERMABOND ADVANCED .7 DNX12 (GAUZE/BANDAGES/DRESSINGS) ×2 IMPLANT
DRAIN CHANNEL RND F F (WOUND CARE) IMPLANT
DRAPE ARM DVNC X/XI (DISPOSABLE) ×8 IMPLANT
DRAPE COLUMN DVNC XI (DISPOSABLE) ×2 IMPLANT
DRAPE DA VINCI XI ARM (DISPOSABLE) ×16
DRAPE DA VINCI XI COLUMN (DISPOSABLE) ×4
DRAPE SURG IRRIG POUCH 19X23 (DRAPES) ×4 IMPLANT
DRSG TEGADERM 4X4.75 (GAUZE/BANDAGES/DRESSINGS) ×4 IMPLANT
ELECT REM PT RETURN 15FT ADLT (MISCELLANEOUS) ×4 IMPLANT
GLOVE BIO SURGEON STRL SZ 6.5 (GLOVE) ×3 IMPLANT
GLOVE BIO SURGEON STRL SZ7.5 (GLOVE) ×8 IMPLANT
GLOVE BIO SURGEONS STRL SZ 6.5 (GLOVE) ×1
GOWN STRL REUS W/TWL LRG LVL3 (GOWN DISPOSABLE) ×4 IMPLANT
GOWN STRL REUS W/TWL XL LVL3 (GOWN DISPOSABLE) ×8 IMPLANT
HEMOSTAT ARISTA ABSORB 3G PWDR (HEMOSTASIS) ×4 IMPLANT
HEMOSTAT SURGICEL 4X8 (HEMOSTASIS) ×4 IMPLANT
HOLDER FOLEY CATH W/STRAP (MISCELLANEOUS) ×4 IMPLANT
IRRIG SUCT STRYKERFLOW 2 WTIP (MISCELLANEOUS) ×4
IRRIGATION SUCT STRKRFLW 2 WTP (MISCELLANEOUS) ×2 IMPLANT
IV LACTATED RINGERS 1000ML (IV SOLUTION) ×4 IMPLANT
KIT TURNOVER KIT A (KITS) IMPLANT
NEEDLE INSUFFLATION 14GA 120MM (NEEDLE) ×4 IMPLANT
PACK ROBOT UROLOGY CUSTOM (CUSTOM PROCEDURE TRAY) ×4 IMPLANT
PAD POSITIONING PINK XL (MISCELLANEOUS) ×4 IMPLANT
PENCIL SMOKE EVACUATOR (MISCELLANEOUS) IMPLANT
SEAL CANN UNIV 5-8 DVNC XI (MISCELLANEOUS) ×8 IMPLANT
SEAL XI 5MM-8MM UNIVERSAL (MISCELLANEOUS) ×16
SET TUBE SMOKE EVAC HIGH FLOW (TUBING) ×4 IMPLANT
SOLUTION ELECTROLUBE (MISCELLANEOUS) ×4 IMPLANT
SPONGE LAP 4X18 RFD (DISPOSABLE) ×4 IMPLANT
STAPLE RELOAD 45 GRN (STAPLE) ×2 IMPLANT
STAPLE RELOAD 45MM GREEN (STAPLE) ×4
SURGIFLO W/THROMBIN 8M KIT (HEMOSTASIS) ×4 IMPLANT
SUT ETHILON 3 0 PS 1 (SUTURE) ×4 IMPLANT
SUT MNCRL AB 4-0 PS2 18 (SUTURE) ×8 IMPLANT
SUT VIC AB 0 UR5 27 (SUTURE) ×4 IMPLANT
SUT VIC AB 2-0 SH 27 (SUTURE) ×8
SUT VIC AB 2-0 SH 27X BRD (SUTURE) ×4 IMPLANT
SUT VICRYL 0 UR6 27IN ABS (SUTURE) ×4 IMPLANT
SUT VLOC BARB 180 ABS3/0GR12 (SUTURE) ×8
SUTURE VLOC BRB 180 ABS3/0GR12 (SUTURE) ×4 IMPLANT
TOWEL OR NON WOVEN STRL DISP B (DISPOSABLE) ×4 IMPLANT
TROCAR XCEL NON-BLD 5MMX100MML (ENDOMECHANICALS) IMPLANT
WATER STERILE IRR 1000ML POUR (IV SOLUTION) ×4 IMPLANT

## 2019-10-24 NOTE — Transfer of Care (Signed)
Immediate Anesthesia Transfer of Care Note  Patient: Jeremiah Richards  Procedure(s) Performed: XI ROBOTIC ASSISTED LAPAROSCOPIC RADICAL PROSTATECTOMY (N/A Abdomen) BILATERAL PELVIC LYMPH NODE DISSECTION (Bilateral )  Patient Location: PACU  Anesthesia Type:General  Level of Consciousness: drowsy  Airway & Oxygen Therapy: Patient Spontanous Breathing and Patient connected to face mask oxygen  Post-op Assessment: Report given to RN and Post -op Vital signs reviewed and stable  Post vital signs: Reviewed and stable  Last Vitals:  Vitals Value Taken Time  BP 160/96 10/24/19 1634  Temp    Pulse 83 10/24/19 1637  Resp 19 10/24/19 1637  SpO2 98 % 10/24/19 1637  Vitals shown include unvalidated device data.  Last Pain:  Vitals:   10/24/19 1010  TempSrc: Oral         Complications: No complications documented.

## 2019-10-24 NOTE — Anesthesia Procedure Notes (Signed)
Procedure Name: Intubation Date/Time: 10/24/2019 11:57 AM Performed by: Raenette Rover, CRNA Pre-anesthesia Checklist: Patient identified, Emergency Drugs available, Suction available and Patient being monitored Patient Re-evaluated:Patient Re-evaluated prior to induction Oxygen Delivery Method: Circle system utilized Preoxygenation: Pre-oxygenation with 100% oxygen Induction Type: IV induction Ventilation: Mask ventilation without difficulty Laryngoscope Size: Mac and 4 Grade View: Grade I Tube type: Oral Tube size: 7.5 mm Number of attempts: 1 Airway Equipment and Method: Stylet Placement Confirmation: ETT inserted through vocal cords under direct vision,  positive ETCO2 and breath sounds checked- equal and bilateral Secured at: 24 cm Tube secured with: Tape Dental Injury: Teeth and Oropharynx as per pre-operative assessment  Comments: Performed by paramedic student, Nada Boozer, supervised by CRNA and MDA

## 2019-10-24 NOTE — Progress Notes (Signed)
During bedside shift report, pt was difficult to arouse. Pt started to become more responsive. Vital signs checked and stable. Will continue to monitor.

## 2019-10-24 NOTE — Op Note (Signed)
Operative Note  Preoperative diagnosis:  1.  Prostate cancer   Postoperative diagnosis: 1.  Prostate cancer  Procedure(s): 1.  Robotic assisted laparoscopic prostatectomy with bilateral pelvic lymph node dissection  Surgeon: Link Snuffer, MD  Assistants: Rockne Menghini assistant was needed due to the nature of the case being a robotic surgery requiring a bedside assistant for retraction, suctioning, exchanging instruments, etc.   Resident: Celene Squibb  Anesthesia: General   Complications: None   EBL: 950 cc   Specimens: 1. prostate and seminal vesicles 2.  Periprosthetic fat 3.  Right pelvic lymph node packet 4.  Left pelvic lymph node packet  Drains/Catheters: 1. 90 French Foley catheter and JP drain   Intraoperative findings: prostate and seminal vesicles were removed en bloc Without any evidence of gross extraprostatic disease  Indication: 64 year old male with prostate cancer presents for the previously mentioned operation.  Description of procedure:  The patient was identified and consent was obtained.  The patient was taken to the operating room and placed in the supine position.  The patient was placed under general anesthesia.  Perioperative antibiotics were administered.  The patient was placed in dorsal lithotomy.  Patient was prepped and draped in a standard sterile fashion and a timeout was performed.  The patient was placed in steep Trendelenburg.  Foley catheter was placed.  Veress needle was inserted supraumbilical and the drop test was performed with no evidence of any injury.  The abdomen was then insufflated to a pressure of 15.  4 robotic working ports, a 12 mm assistant port, and a 5 mm assistant port were placed under direct visualization in a standard fashion for a robotic pelvic surgery.  The abdomen was inspected and there was no evidence of any visceral or vascular injury.  I first released colonic adhesions in the left lower quadrant.  I then  retracted the sigmoid and rectum superiorly.  I first started with the posterior dissection by incising along the posterior peritoneum and identifying bilateral vas deferens.  These were divided and bilateral seminal vesicles were carefully dissected out.  Denonvier fascia was then entered posteriorly.  The bladder was then dropped by incising along the medial umbilical ligament bilaterally.  Periprosthetic fat was cleared off the prostate and passed off and passed off for specimen.  The endopelvic fascia was incised bilaterally and the puboprostatic ligaments were released.  A stapler with a vascular staple load was used to staple the dorsal venous complex.  Despite the vascular staple load, there was a moderate amount of bleeding from the DVC.  Therefore, I used a 2-0 Vicryl figure of eight for further hemostasis.  There was no significant bleeding from this area after this. I then incised along into the bladder neck and divided the bladder neck.  The catheter was brought out and used for retraction.  I divided the remainder of the bladder neck and then pulled the seminal vesicles out of this incision.  Careful dissection was performed and bilateral prostatic pedicles were released using Hem-o-lok clips and sharp dissection.  Spot cautery was sparingly used.  Periprosthetic tissue was carefully released inferiorly and laterally, performing a nerve sparing operation bilaterally.  Once only the urethra remained attached, the anterior portion of the urethra was divided.  A 2-0 Vicryl stitch was placed at the 6 o'clock position.  The remainder of the urethra was divided and the prostate was placed in a specimen bag.  FloSeal was applied to the prostatectomy bed.  I then performed the bilateral pelvic  lymph node dissection.  I first carefully removed lymph node packet from the right side overlying the external iliac artery and vein down to the level of the obturator nerve.  This was passed off for specimen and then  I carefully removed the left sided pelvic lymph node packets again overlying the external iliac artery and vein down to the level of the obturator nerve taking care not to injure any vital structures.  The 2-0 Vicryl stitch was used to reapproximate the bladder and urethra at the 6 o'clock position.  A running 3-0 V lock suture was then used to reapproximate the bladder and urethra.  The bladder was filled with normal saline and there was no evidence of any leak at the anastomosis.  The anastomosis was watertight.  The Foley catheter was exchanged for a fresh catheter.  Surgicel was applied to the prostatectomy bed as well as the area of the DVC.  Arista was also used for hemostasis. A JP drain was inserted from the left lateral port site.  This was secured down with a nylon stitch.  The robot was undocked and the patient was taken out of Trendelenburg.  All working ports were removed under direct visualization with the camera to ensure there was no bleeding from the sites.  The midline port incision was extended and the prostate extracted in the specimen bag.  Interrupted figure-of-eight 0 Vicryl sutures were used to close  the fascia.  The 12 mm assistant port fascia was also closed with a 2-0 Vicryl.  Skin was then closed with 4-0 Monocryl and Dermabond.  Exparel was used for anesthetic effect.  This concluded the operation.  The patient tolerated procedure well and was stable postoperatively.  Plan: Will obtain stat labs.  He will remain in the hospital overnight and hopefully be able to be discharged tomorrow.  He will keep his catheter for 7-10 days.

## 2019-10-24 NOTE — Anesthesia Postprocedure Evaluation (Signed)
Anesthesia Post Note  Patient: Jeremiah Richards  Procedure(s) Performed: XI ROBOTIC ASSISTED LAPAROSCOPIC RADICAL PROSTATECTOMY (N/A Abdomen) BILATERAL PELVIC LYMPH NODE DISSECTION (Bilateral )     Patient location during evaluation: PACU Anesthesia Type: General Level of consciousness: sedated and patient cooperative Pain management: pain level controlled Vital Signs Assessment: post-procedure vital signs reviewed and stable Respiratory status: spontaneous breathing Cardiovascular status: stable Anesthetic complications: no   No complications documented.  Last Vitals:  Vitals:   10/24/19 1645 10/24/19 1700  BP: (!) 144/87 130/79  Pulse: 74 71  Resp: 14 12  Temp:    SpO2: 100% 96%    Last Pain:  Vitals:   10/24/19 1010  TempSrc: Oral                 Nolon Nations

## 2019-10-24 NOTE — H&P (Signed)
CC/HPI: CC: Prostate cancer , microscopic hematuria  HPI:  01/19/2019  Patient is status post prostate biopsy. He is doing well in this regard.   PSA 9.9  Prostate size 25 g  Normal digital rectal exam  He has had an open appendectomy  BMI 29  He has had an open appendectomy, no other abdominal surgeries   Unfortunately, biopsy revealed adenocarcinoma of the prostate in 2/12 cores, maximum Gleason score of 3+4, maximum involvement of 40%.   03/10/2019  Patient has had time for further reflection. After further reflection, he has elected to undergo robotic assisted laparoscopic prostatectomy with bilateral pelvic lymph node dissection.   05/26/2019  Patient has persistent microscopic hematuria despite adequate time to heal from prostate biopsy. He has not had any gross hematuria. In the interval, he was scheduled for robotic prostatectomy. However, he developed a DVT and is now on Xarelto and needs to stay on that for 3 months.   08/09/2019  In the interval, the patient underwent a CT IVP. This revealed a possible ureteral filling defect in the left Proximal ureter. He has persistent microscopic hematuria today.   09/20/2019  Patient is status post left diagnostic ureteroscopy with stent placement. No tumor was seen. He has been having significant pain and discomfort from the stent. He presents for stent removal. He is ready to proceed with robotic assisted laparoscopic prostatectomy with bilateral pelvic lymph node dissection.   10/10/2019  Patient's pain has resolved. However, he feels like he was traumatized by the event and the pain involved. He is very nervous about undergoing robotic prostatectomy. He comes in to discuss this today.     ALLERGIES: No Allergies    MEDICATIONS: Atorvastatin Calcium 10 mg tablet  Hydrocodone-Acetaminophen 5 mg-325 mg tablet 1 tablet PO Q 6 H PRN For severe pain  Multiple Vitamin  Xarelto     GU PSH: Cysto Remove Stent FB Sim -  09/20/2019 Cystoscopy Insert Stent, Left - 09/12/2019 Cystoscopy Ureteroscopy, Left - 09/12/2019 Locm 300-399Mg /Ml Iodine,1Ml - 06/10/2019 Prostate Needle Biopsy - 01/13/2019     NON-GU PSH: Appendectomy (open) Knee Arthroscopy Surgical Pathology, Gross And Microscopic Examination For Prostate Needle - 01/13/2019     GU PMH: Renal colic - 6/38/4665 Microscopic hematuria - 05/26/2019 Prostate Cancer - 01/19/2019 BPH w/LUTS - 12/07/2018 Elevated PSA - 12/07/2018 Nocturia - 12/07/2018 Urinary Frequency - 12/07/2018 Weak Urinary Stream - 12/07/2018    NON-GU PMH: Constipation, unspecified, The patient has not had a bowel movement for a few days and is on around the clock opioids. - 09/14/2019 Muscle weakness (generalized) - 06/22/2019, - 04/20/2019 Other muscle spasm - 06/22/2019, - 04/20/2019 Other specified disorders of muscle - 06/22/2019, - 04/20/2019 Acute embolism and thrombosis of other specified deep vein of left lower extremity Hypercholesterolemia Hypertension Unspecified right bundle-branch block    FAMILY HISTORY: Cancer - Father Death of family member - Mother, Father Parkinson's Disease - Mother pneumonia - Father   SOCIAL HISTORY: Marital Status: Married Preferred Language: English; Race: White Current Smoking Status: Patient smokes. Has smoked since 11/28/1978. Smokes 1/2 pack per day.   Tobacco Use Assessment Completed: Used Tobacco in last 30 days? Does not use smokeless tobacco. Has never drank.  Drinks 3 caffeinated drinks per day. Patient's occupation Brewing technologist (semi-retired).    REVIEW OF SYSTEMS:    GU Review Male:   Patient denies hard to postpone urination, burning/ pain with urination, leakage of urine, trouble starting your stream, have to strain to urinate , and penile  pain.  Gastrointestinal (Upper):   Patient denies vomiting and nausea.  Gastrointestinal (Lower):   Patient denies diarrhea and constipation.  Constitutional:   Patient denies fever,  night sweats, weight loss, and fatigue.  Skin:   Patient denies skin rash/ lesion and itching.  Eyes:   Patient denies blurred vision and double vision.  Ears/ Nose/ Throat:   Patient denies sore throat and sinus problems.  Hematologic/Lymphatic:   Patient denies swollen glands.  Cardiovascular:   Patient denies leg swelling and chest pains.  Respiratory:   Patient denies cough and shortness of breath.  Endocrine:   Patient denies excessive thirst.  Musculoskeletal:   Patient denies back pain and joint pain.  Neurological:   Patient denies headaches and dizziness.  Psychologic:   Patient denies depression and anxiety.   Notes: "whole grain diet" Updated from previous visit 05/26/2019 with review from patient as noted above.   VITAL SIGNS:      10/10/2019 02:30 PM  Weight 211 lb / 95.71 kg  BP 122/80 mmHg  Heart Rate 69 /min  Temperature 98.6 F / 37 C   Complexity of Data:  Source Of History:  Patient  Lab Test Review:   PSA  Records Review:   Pathology Reports, Previous Patient Records   12/08/18  PSA  Total PSA 9.90 ng/mL    PROCEDURES: None   ASSESSMENT:      ICD-10 Details  1 GU:   Prostate Cancer - C61 Chronic, Stable     PLAN:           Document Letter(s):  Created for Patient: Clinical Summary         Notes:   After full discussion of the procedure and its risks and benefits, the patient elects to proceed with robotic prostatectomy with bilateral pelvic lymph node dissection as previously discussed.   CC: Marilynne Drivers        Next Appointment:      Next Appointment: 10/10/2019 02:15 PM    Appointment Type: Office Visit Established Patient    Location: [facility] [phone]    Provider: Link Snuffer, III, M.D.    Reason for Visit: PRE OP PER PT REQUEST     Signed by Link Snuffer, III, M.D. on 10/10/19 at 3:04 PM (EDT

## 2019-10-25 ENCOUNTER — Encounter (HOSPITAL_COMMUNITY): Payer: Self-pay | Admitting: Urology

## 2019-10-25 DIAGNOSIS — C61 Malignant neoplasm of prostate: Secondary | ICD-10-CM | POA: Diagnosis not present

## 2019-10-25 LAB — BASIC METABOLIC PANEL
Anion gap: 11 (ref 5–15)
BUN: 15 mg/dL (ref 8–23)
CO2: 27 mmol/L (ref 22–32)
Calcium: 8.9 mg/dL (ref 8.9–10.3)
Chloride: 100 mmol/L (ref 98–111)
Creatinine, Ser: 1.08 mg/dL (ref 0.61–1.24)
GFR calc Af Amer: >60 mL/min (ref >60)
GFR calc non Af Amer: >60 mL/min (ref >60)
Glucose, Bld: 165 mg/dL — ABNORMAL HIGH (ref 70–99)
Potassium: 4.3 mmol/L (ref 3.5–5.1)
Sodium: 138 mmol/L (ref 135–145)

## 2019-10-25 LAB — HEMOGLOBIN AND HEMATOCRIT, BLOOD
HCT: 36.9 % — ABNORMAL LOW (ref 39.0–52.0)
Hemoglobin: 12.7 g/dL — ABNORMAL LOW (ref 13.0–17.0)

## 2019-10-25 MED ORDER — CHLORHEXIDINE GLUCONATE CLOTH 2 % EX PADS
6.0000 | MEDICATED_PAD | Freq: Every day | CUTANEOUS | Status: DC
  Administered 2019-10-25: 6 via TOPICAL

## 2019-10-25 MED ORDER — ENOXAPARIN SODIUM 40 MG/0.4ML ~~LOC~~ SOLN
40.0000 mg | SUBCUTANEOUS | Status: DC
  Administered 2019-10-25: 40 mg via SUBCUTANEOUS
  Filled 2019-10-25: qty 0.4

## 2019-10-25 NOTE — Discharge Summary (Signed)
  Date of admission: 10/24/2019  Date of discharge: 10/25/2019  Admission diagnosis: Prostate Cancer  Discharge diagnosis: Prostate Cancer  History and Physical: For full details, please see admission history and physical. Briefly, Jeremiah Richards is a 64 y.o. gentleman with localized prostate cancer.  After discussing management/treatment options, he elected to proceed with surgical treatment.  Hospital Course: Jeremiah Richards was taken to the operating room on 10/24/2019 and underwent a robotic assisted laparoscopic radical prostatectomy. He tolerated this procedure well and without complications. Postoperatively, he was able to be transferred to a regular hospital room following recovery from anesthesia.  He was able to begin ambulating the night of surgery. He remained hemodynamically stable overnight.  He had excellent urine output with appropriately minimal output from his pelvic drain and his pelvic drain was removed on POD #1.  He was transitioned to oral pain medication, tolerated a clear liquid diet, and had met all discharge criteria and was able to be discharged home later on POD#1.  Laboratory values: Recent Labs    10/24/19 1650 10/25/19 0501  HGB 13.2 12.7*  HCT 38.4* 36.9*    Disposition: Home  Discharge instruction: He was instructed to be ambulatory but to refrain from heavy lifting, strenuous activity, or driving. He was instructed on urethral catheter care.  Discharge medications:   Allergies as of 10/25/2019   No Known Allergies     Medication List    STOP taking these medications   multivitamin with minerals tablet   Xarelto 20 MG Tabs tablet Generic drug: rivaroxaban     TAKE these medications   acetaminophen 500 MG tablet Commonly known as: TYLENOL Take 500 mg by mouth every 6 (six) hours as needed (knee pain.).   atorvastatin 40 MG tablet Commonly known as: LIPITOR Take 1 tablet (40 mg total) by mouth daily. What changed: when to take this    HYDROcodone-acetaminophen 5-325 MG tablet Commonly known as: Norco Take 1-2 tablets by mouth every 6 (six) hours as needed for moderate pain. What changed:   how much to take  when to take this   sulfamethoxazole-trimethoprim 800-160 MG tablet Commonly known as: BACTRIM DS Take 1 tablet by mouth 2 (two) times daily. Start the day prior to foley removal appointment       Followup: He will followup in 1 week for catheter removal and to discuss his surgical pathology results.

## 2019-10-25 NOTE — Progress Notes (Signed)
Patient explained and provided discharge instructions.  Foley care explained and patient demonstrated through teach back.  IVs removed and JP drain removed with dressing applied.  Patient sent home with all necessary supplies for drain site and foley.  Patient also provided with extra supplies until follow up appointment.  Patient was dressed in personal clothing and taken to main entrance with all belongings.

## 2019-10-25 NOTE — Discharge Instructions (Signed)
1. Activity:  You are encouraged to ambulate frequently (about every hour during waking hours) to help prevent blood clots from forming in your legs or lungs.  However, you should not engage in any heavy lifting (> 10-15 lbs), strenuous activity, or straining. 2. Diet: You should continue a clear liquid diet until passing gas from below.  Once this occurs, you may advance your diet to a soft diet that would be easy to digest (i.e soups, scrambled eggs, mashed potatoes, etc.) for 24 hours just as you would if getting over a bad stomach flu.  If tolerating this diet well for 24 hours, you may then begin eating regular food.  It will be normal to have some amount of bloating, nausea, and abdominal discomfort intermittently. 3. Prescriptions:  You will be provided a prescription for pain medication to take as needed.  If your pain is not severe enough to require the prescription pain medication, you may take Tylenol instead.  You should also take an over the counter stool softener for 7 days after surgery (Colace 100 mg twice daily) to avoid straining with bowel movements as the pain medication may constipate you. If you haven't have a bowel movement 2 days after surgery use a laxative to aid you. Finally, you will also be provided a prescription for an antibiotic to begin the day prior to your return visit in the office for catheter removal. 4. Catheter care: You will be taught how to take care of the catheter by the nursing staff prior to discharge from the hospital.  You may use both a leg bag and the larger bedside bag but it is recommended to at least use the bigger bedside bag at nighttime as the leg bag is small and will fill up overnight and also does not drain as well when lying flat. You may periodically feel a strong urge to void with the catheter in place.  This is a bladder spasm and most often can occur when having a bowel movement or when you are moving around. It is typically self-limited and usually  will stop after a few minutes.  You may use some Vaseline or Neosporin around the tip of the catheter to reduce friction at the tip of the penis. 5. Incisions: You may remove your dressing bandages the 2nd day after surgery.  You most likely will have a few small staples in each of the incisions and once the bandages are removed, the incisions may stay open to air.  You may start showering (not soaking or bathing in water) 48 hours after surgery and the incisions simply need to be patted dry after the shower.  No additional care is needed. 6. What to call us about: You should call the office [phone]) if you develop fever > 101, persistent vomiting, or the catheter stops draining. Also, feel free to call with any other questions you may have and remember the handout that was provided to you as a reference preoperatively which answers many of the common questions that arise after surgery. 7. You may resume aspirin, advil, aleve, vitamins, and supplements 7 days after surgery.

## 2019-10-28 LAB — SURGICAL PATHOLOGY

## 2019-11-01 DIAGNOSIS — C61 Malignant neoplasm of prostate: Secondary | ICD-10-CM | POA: Diagnosis not present

## 2019-11-30 DIAGNOSIS — M62838 Other muscle spasm: Secondary | ICD-10-CM | POA: Diagnosis not present

## 2019-11-30 DIAGNOSIS — C61 Malignant neoplasm of prostate: Secondary | ICD-10-CM | POA: Diagnosis not present

## 2019-11-30 DIAGNOSIS — M6281 Muscle weakness (generalized): Secondary | ICD-10-CM | POA: Diagnosis not present

## 2019-11-30 DIAGNOSIS — R350 Frequency of micturition: Secondary | ICD-10-CM | POA: Diagnosis not present

## 2019-11-30 DIAGNOSIS — M6289 Other specified disorders of muscle: Secondary | ICD-10-CM | POA: Diagnosis not present

## 2020-01-24 DIAGNOSIS — E7800 Pure hypercholesterolemia, unspecified: Secondary | ICD-10-CM | POA: Diagnosis not present

## 2020-01-24 DIAGNOSIS — M25562 Pain in left knee: Secondary | ICD-10-CM | POA: Diagnosis not present

## 2020-01-24 DIAGNOSIS — Z7901 Long term (current) use of anticoagulants: Secondary | ICD-10-CM | POA: Diagnosis not present

## 2020-01-24 DIAGNOSIS — Z8546 Personal history of malignant neoplasm of prostate: Secondary | ICD-10-CM | POA: Diagnosis not present

## 2020-02-09 DIAGNOSIS — C61 Malignant neoplasm of prostate: Secondary | ICD-10-CM | POA: Diagnosis not present

## 2020-02-16 DIAGNOSIS — C61 Malignant neoplasm of prostate: Secondary | ICD-10-CM | POA: Diagnosis not present

## 2020-02-16 DIAGNOSIS — N5231 Erectile dysfunction following radical prostatectomy: Secondary | ICD-10-CM | POA: Diagnosis not present

## 2020-03-28 DIAGNOSIS — M1712 Unilateral primary osteoarthritis, left knee: Secondary | ICD-10-CM | POA: Diagnosis not present

## 2020-05-16 DIAGNOSIS — C61 Malignant neoplasm of prostate: Secondary | ICD-10-CM | POA: Diagnosis not present

## 2020-06-28 DIAGNOSIS — Z0000 Encounter for general adult medical examination without abnormal findings: Secondary | ICD-10-CM | POA: Diagnosis not present

## 2020-06-28 DIAGNOSIS — Z23 Encounter for immunization: Secondary | ICD-10-CM | POA: Diagnosis not present

## 2020-06-28 DIAGNOSIS — E7800 Pure hypercholesterolemia, unspecified: Secondary | ICD-10-CM | POA: Diagnosis not present

## 2020-06-28 DIAGNOSIS — Z1389 Encounter for screening for other disorder: Secondary | ICD-10-CM | POA: Diagnosis not present

## 2020-07-31 DIAGNOSIS — C61 Malignant neoplasm of prostate: Secondary | ICD-10-CM | POA: Diagnosis not present

## 2020-08-14 DIAGNOSIS — R351 Nocturia: Secondary | ICD-10-CM | POA: Diagnosis not present

## 2020-08-14 DIAGNOSIS — C61 Malignant neoplasm of prostate: Secondary | ICD-10-CM | POA: Diagnosis not present

## 2020-11-14 DIAGNOSIS — M1712 Unilateral primary osteoarthritis, left knee: Secondary | ICD-10-CM | POA: Diagnosis not present

## 2020-11-14 DIAGNOSIS — M25562 Pain in left knee: Secondary | ICD-10-CM | POA: Diagnosis not present

## 2020-11-21 DIAGNOSIS — C61 Malignant neoplasm of prostate: Secondary | ICD-10-CM | POA: Diagnosis not present

## 2020-11-28 DIAGNOSIS — C61 Malignant neoplasm of prostate: Secondary | ICD-10-CM | POA: Diagnosis not present

## 2020-11-28 DIAGNOSIS — N5231 Erectile dysfunction following radical prostatectomy: Secondary | ICD-10-CM | POA: Diagnosis not present

## 2020-11-29 DIAGNOSIS — M25562 Pain in left knee: Secondary | ICD-10-CM | POA: Diagnosis not present

## 2020-11-29 DIAGNOSIS — M1712 Unilateral primary osteoarthritis, left knee: Secondary | ICD-10-CM | POA: Diagnosis not present

## 2020-12-12 DIAGNOSIS — M25562 Pain in left knee: Secondary | ICD-10-CM | POA: Diagnosis not present

## 2020-12-12 DIAGNOSIS — M1712 Unilateral primary osteoarthritis, left knee: Secondary | ICD-10-CM | POA: Diagnosis not present

## 2020-12-17 DIAGNOSIS — M1712 Unilateral primary osteoarthritis, left knee: Secondary | ICD-10-CM | POA: Diagnosis not present

## 2020-12-17 DIAGNOSIS — M25562 Pain in left knee: Secondary | ICD-10-CM | POA: Diagnosis not present

## 2020-12-27 DIAGNOSIS — M25562 Pain in left knee: Secondary | ICD-10-CM | POA: Diagnosis not present

## 2020-12-27 DIAGNOSIS — M1712 Unilateral primary osteoarthritis, left knee: Secondary | ICD-10-CM | POA: Diagnosis not present

## 2021-01-03 DIAGNOSIS — M1712 Unilateral primary osteoarthritis, left knee: Secondary | ICD-10-CM | POA: Diagnosis not present

## 2021-01-03 DIAGNOSIS — M25562 Pain in left knee: Secondary | ICD-10-CM | POA: Diagnosis not present

## 2021-04-25 DIAGNOSIS — C61 Malignant neoplasm of prostate: Secondary | ICD-10-CM | POA: Diagnosis not present

## 2021-05-02 DIAGNOSIS — N5231 Erectile dysfunction following radical prostatectomy: Secondary | ICD-10-CM | POA: Diagnosis not present

## 2021-05-02 DIAGNOSIS — C61 Malignant neoplasm of prostate: Secondary | ICD-10-CM | POA: Diagnosis not present

## 2021-07-10 DIAGNOSIS — M1712 Unilateral primary osteoarthritis, left knee: Secondary | ICD-10-CM | POA: Diagnosis not present

## 2021-07-10 DIAGNOSIS — M25562 Pain in left knee: Secondary | ICD-10-CM | POA: Diagnosis not present

## 2021-07-18 DIAGNOSIS — M1712 Unilateral primary osteoarthritis, left knee: Secondary | ICD-10-CM | POA: Diagnosis not present

## 2021-07-18 DIAGNOSIS — M25562 Pain in left knee: Secondary | ICD-10-CM | POA: Diagnosis not present

## 2021-07-23 DIAGNOSIS — R7303 Prediabetes: Secondary | ICD-10-CM | POA: Diagnosis not present

## 2021-07-23 DIAGNOSIS — Z8546 Personal history of malignant neoplasm of prostate: Secondary | ICD-10-CM | POA: Diagnosis not present

## 2021-07-23 DIAGNOSIS — Z23 Encounter for immunization: Secondary | ICD-10-CM | POA: Diagnosis not present

## 2021-07-23 DIAGNOSIS — F17200 Nicotine dependence, unspecified, uncomplicated: Secondary | ICD-10-CM | POA: Diagnosis not present

## 2021-07-23 DIAGNOSIS — E7800 Pure hypercholesterolemia, unspecified: Secondary | ICD-10-CM | POA: Diagnosis not present

## 2021-07-23 DIAGNOSIS — Z0000 Encounter for general adult medical examination without abnormal findings: Secondary | ICD-10-CM | POA: Diagnosis not present

## 2021-08-01 DIAGNOSIS — M1712 Unilateral primary osteoarthritis, left knee: Secondary | ICD-10-CM | POA: Diagnosis not present

## 2021-08-01 DIAGNOSIS — M25562 Pain in left knee: Secondary | ICD-10-CM | POA: Diagnosis not present

## 2021-08-08 DIAGNOSIS — C61 Malignant neoplasm of prostate: Secondary | ICD-10-CM | POA: Diagnosis not present

## 2021-08-08 DIAGNOSIS — M25562 Pain in left knee: Secondary | ICD-10-CM | POA: Diagnosis not present

## 2021-08-08 DIAGNOSIS — M1712 Unilateral primary osteoarthritis, left knee: Secondary | ICD-10-CM | POA: Diagnosis not present

## 2021-08-13 DIAGNOSIS — C61 Malignant neoplasm of prostate: Secondary | ICD-10-CM | POA: Diagnosis not present

## 2021-08-13 DIAGNOSIS — N5231 Erectile dysfunction following radical prostatectomy: Secondary | ICD-10-CM | POA: Diagnosis not present

## 2021-08-13 DIAGNOSIS — R3121 Asymptomatic microscopic hematuria: Secondary | ICD-10-CM | POA: Diagnosis not present

## 2021-08-14 DIAGNOSIS — M1712 Unilateral primary osteoarthritis, left knee: Secondary | ICD-10-CM | POA: Diagnosis not present

## 2021-08-14 DIAGNOSIS — M25562 Pain in left knee: Secondary | ICD-10-CM | POA: Diagnosis not present

## 2021-08-20 DIAGNOSIS — R3121 Asymptomatic microscopic hematuria: Secondary | ICD-10-CM | POA: Diagnosis not present

## 2021-08-20 DIAGNOSIS — R319 Hematuria, unspecified: Secondary | ICD-10-CM | POA: Diagnosis not present

## 2021-08-20 DIAGNOSIS — K5730 Diverticulosis of large intestine without perforation or abscess without bleeding: Secondary | ICD-10-CM | POA: Diagnosis not present

## 2021-09-19 DIAGNOSIS — C61 Malignant neoplasm of prostate: Secondary | ICD-10-CM | POA: Diagnosis not present

## 2021-09-19 DIAGNOSIS — R3121 Asymptomatic microscopic hematuria: Secondary | ICD-10-CM | POA: Diagnosis not present

## 2021-12-05 DIAGNOSIS — M1712 Unilateral primary osteoarthritis, left knee: Secondary | ICD-10-CM | POA: Diagnosis not present

## 2021-12-05 DIAGNOSIS — M25562 Pain in left knee: Secondary | ICD-10-CM | POA: Diagnosis not present

## 2021-12-05 DIAGNOSIS — G8929 Other chronic pain: Secondary | ICD-10-CM | POA: Diagnosis not present

## 2021-12-08 IMAGING — CT CT HEART SCORING
2 series · 16 of 20 positions shown, 18 images · non-contrast
Comparison: 03/06/2017
COMPARISON: 03/06/2017

Addendum:
EXAM:
OVER-READ INTERPRETATION  CT CHEST

The following report is an over-read performed by radiologist Dr.
Skems Seaba [REDACTED] on 02/11/2019. This over-read
does not include interpretation of cardiac or coronary anatomy or
pathology. The coronary calcium score interpretation by the
cardiologist is attached.
CLINICAL DATA: Risk stratification
Coronary Calcium Score
TECHNIQUE: The patient was scanned on a Siemens Force scanner. Axial
non-contrast 3 mm slices were carried out through the heart. The
data set was analyzed on a dedicated work station and scored using
the Agatson method.

[Series 3: casc 3.0 i36f 2 bestdiast 70 % · axial · 0.36mm/px · z∈[-286,-178]mm · 8 of 48 slices shown, 10 images]
[im 6/48  vessel]
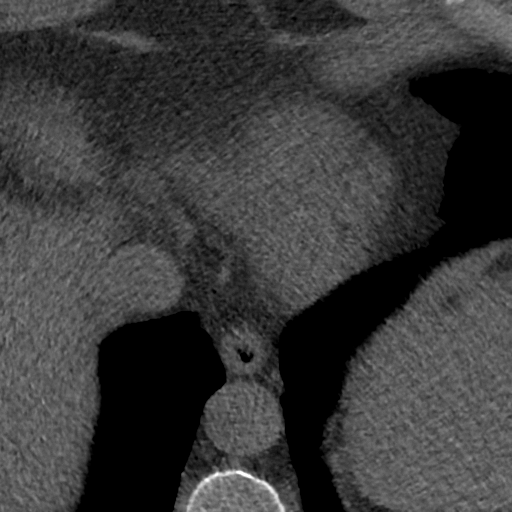
[im 6/48  lung]
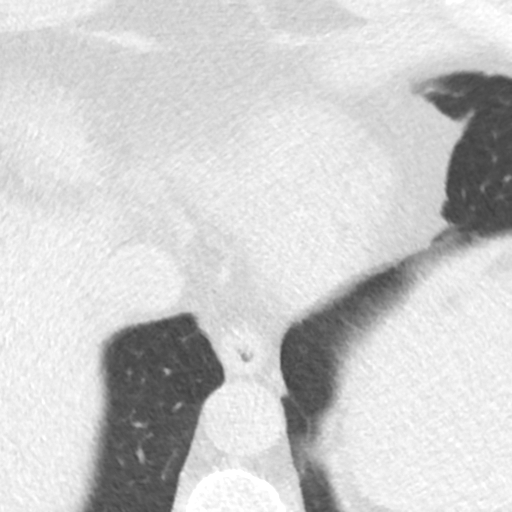
[im 11/48  vessel]
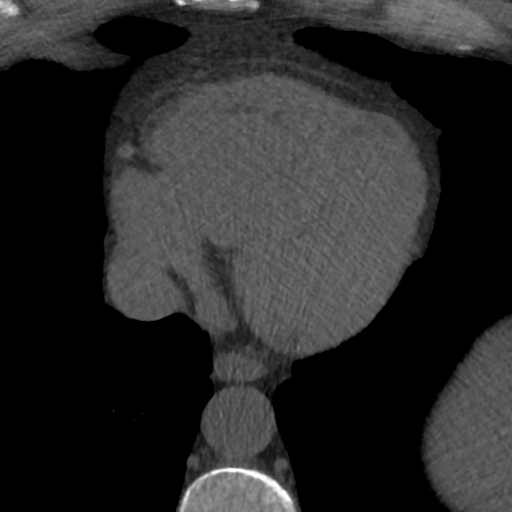
[im 16/48  vessel]
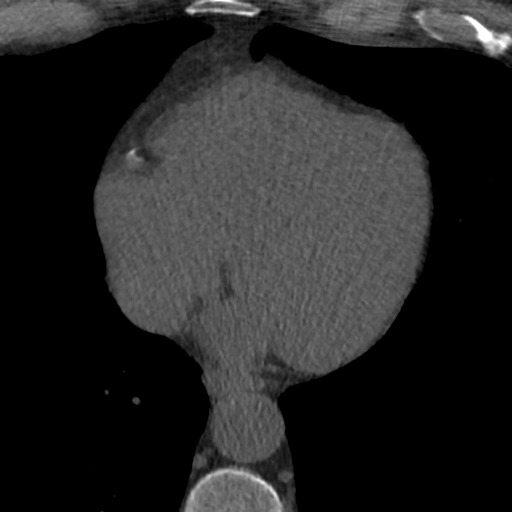
[im 21/48  vessel]
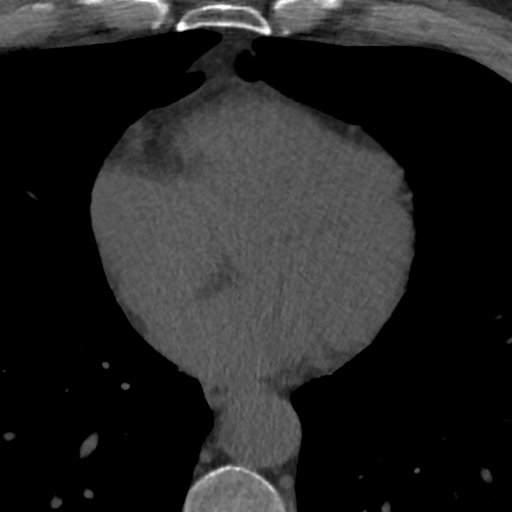
[im 27/48  vessel]
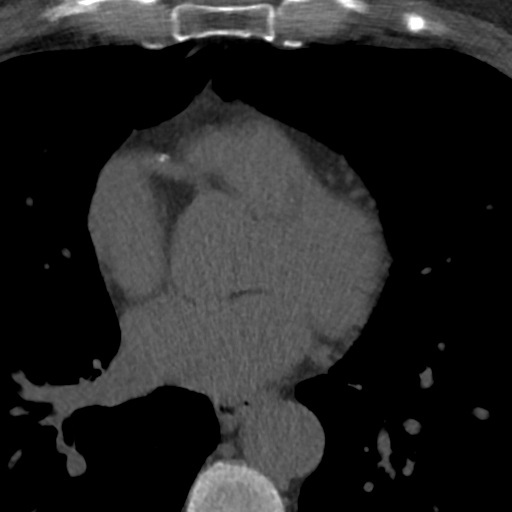
[im 27/48  lung]
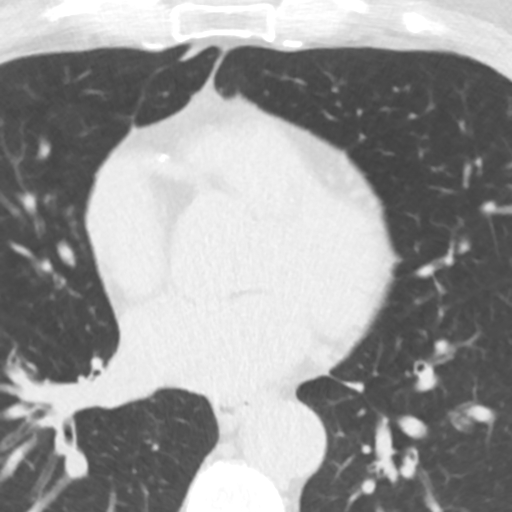
[im 32/48  vessel]
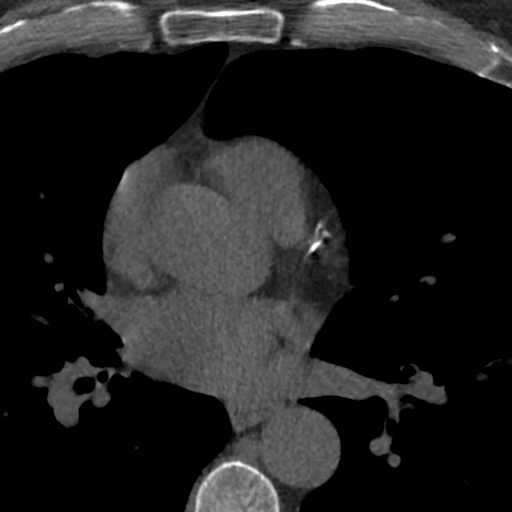
[im 37/48  vessel]
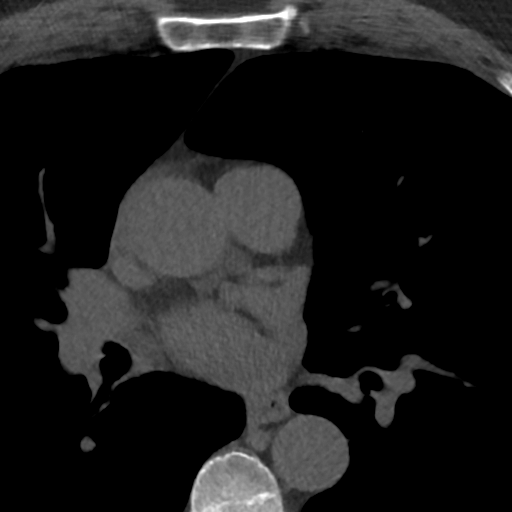
[im 42/48  vessel]
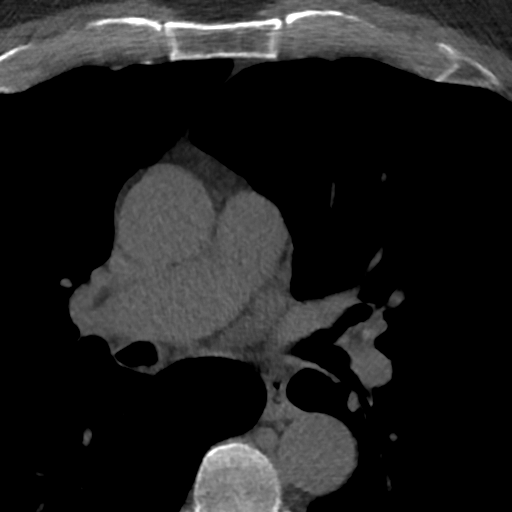

[Series 5: lung st 71 % · axial · 0.72mm/px · z∈[-286,-178]mm · 8 of 48 slices shown]
[im 6/48  lung]
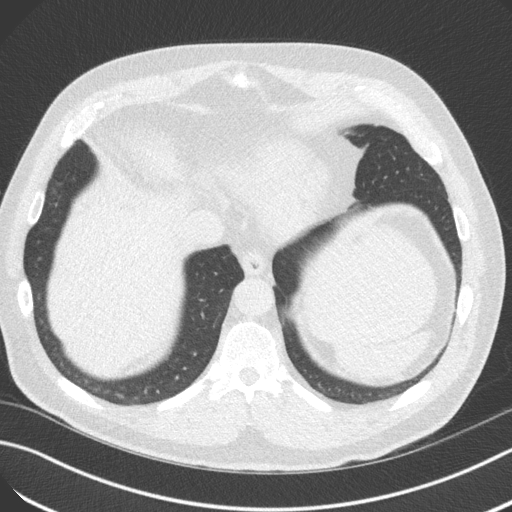
[im 11/48  lung]
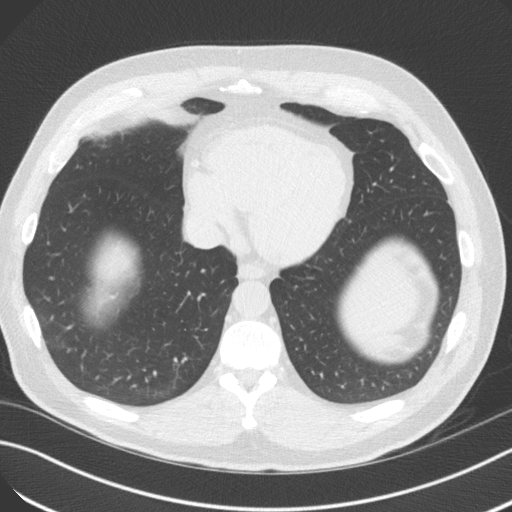
[im 16/48  lung]
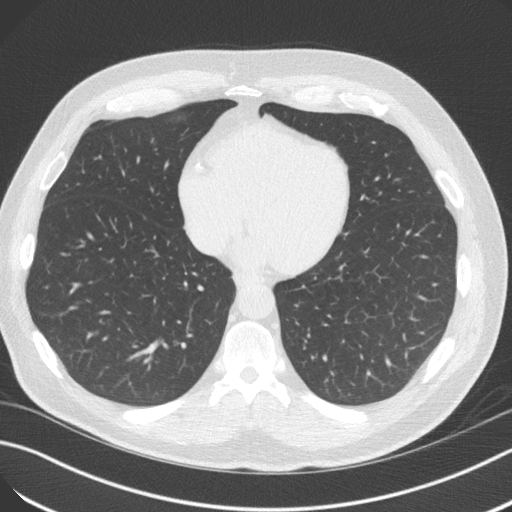
[im 21/48  lung]
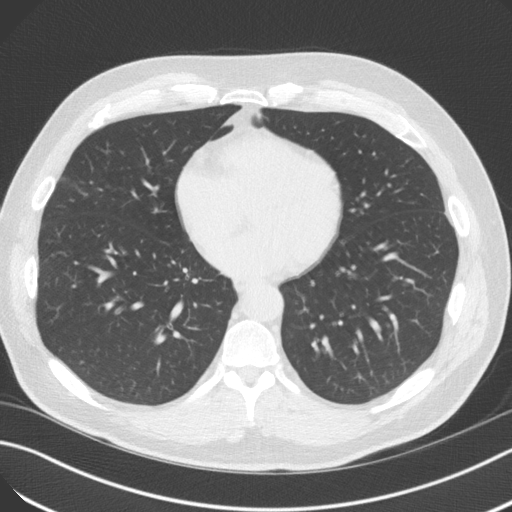
[im 27/48  lung]
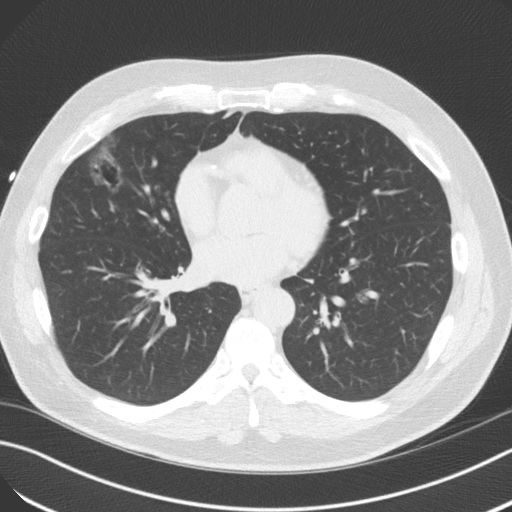
[im 32/48  lung]
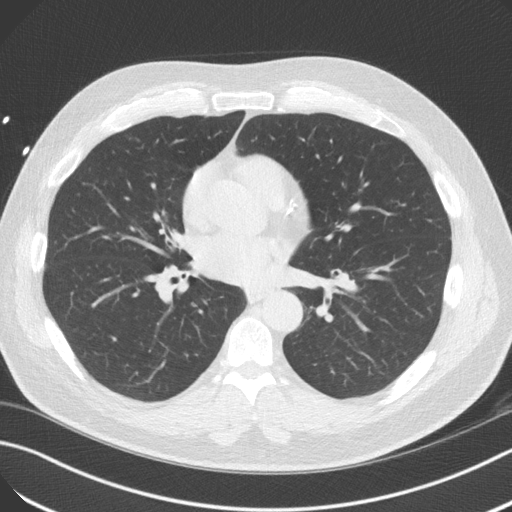
[im 37/48  lung]
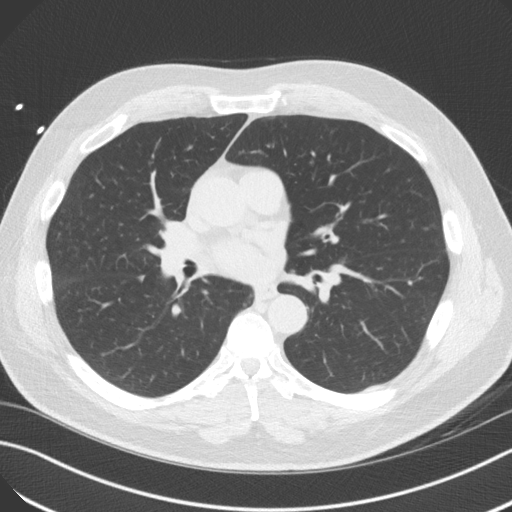
[im 42/48  lung]
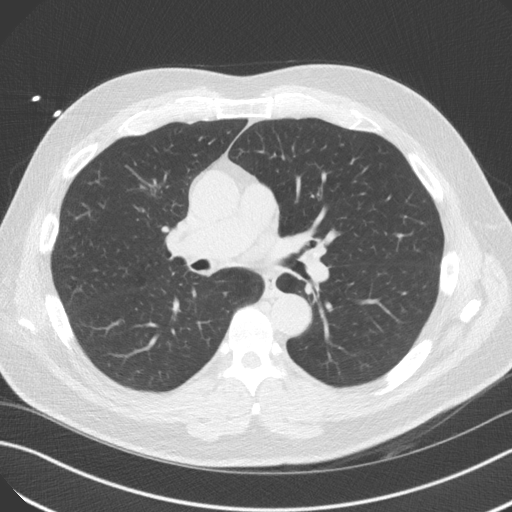

[16 of 20 positions shown; findings below may reference images not displayed]

FINDINGS: Vascular: Heart is normal size.  Visualized aorta normal caliber.

Mediastinum/Nodes: No adenopathy in the lower mediastinum or hila.

Lungs/Pleura: Calcified granulomas in the left upper lobe.
Air-filled cystic area in the right middle lobe. No effusions.

Upper Abdomen: 2 cm low-density lesion in the anterior liver cannot
be characterized on this noncontrast study.

Musculoskeletal: Chest wall soft tissues are unremarkable. No acute
bony abnormality.
IMPRESSION: Old granulomatous disease.

2.7 cm air-filled cyst in the right middle lobe. This has a benign
appearance but can be followed with repeat CT in 6 months to ensure
stability.
FINDINGS: Non-cardiac: See separate report from [REDACTED].

Ascending Aorta: Normal caliber.

Pericardium: Normal

Coronary arteries: Normal origin.  Calcium of the LAD and RCA.
IMPRESSION: Coronary calcium score of 398. This was 79th percentile for age and
sex matched control

*** End of Addendum ***
EXAM:
OVER-READ INTERPRETATION  CT CHEST

The following report is an over-read performed by radiologist Dr.
Skems Seaba [REDACTED] on 02/11/2019. This over-read
does not include interpretation of cardiac or coronary anatomy or
pathology. The coronary calcium score interpretation by the
cardiologist is attached.
FINDINGS: Vascular: Heart is normal size.  Visualized aorta normal caliber.

Mediastinum/Nodes: No adenopathy in the lower mediastinum or hila.

Lungs/Pleura: Calcified granulomas in the left upper lobe.
Air-filled cystic area in the right middle lobe. No effusions.

Upper Abdomen: 2 cm low-density lesion in the anterior liver cannot
be characterized on this noncontrast study.

Musculoskeletal: Chest wall soft tissues are unremarkable. No acute
bony abnormality.
IMPRESSION: Old granulomatous disease.

2.7 cm air-filled cyst in the right middle lobe. This has a benign
appearance but can be followed with repeat CT in 6 months to ensure
stability.

## 2022-01-24 DIAGNOSIS — M1712 Unilateral primary osteoarthritis, left knee: Secondary | ICD-10-CM | POA: Diagnosis not present

## 2022-01-24 DIAGNOSIS — E7800 Pure hypercholesterolemia, unspecified: Secondary | ICD-10-CM | POA: Diagnosis not present

## 2022-01-24 DIAGNOSIS — Z01818 Encounter for other preprocedural examination: Secondary | ICD-10-CM | POA: Diagnosis not present

## 2022-01-24 DIAGNOSIS — Z8546 Personal history of malignant neoplasm of prostate: Secondary | ICD-10-CM | POA: Diagnosis not present

## 2022-01-24 DIAGNOSIS — R9431 Abnormal electrocardiogram [ECG] [EKG]: Secondary | ICD-10-CM | POA: Diagnosis not present

## 2022-01-24 DIAGNOSIS — R062 Wheezing: Secondary | ICD-10-CM | POA: Diagnosis not present

## 2022-02-12 ENCOUNTER — Ambulatory Visit: Payer: PPO | Admitting: Internal Medicine

## 2022-02-12 ENCOUNTER — Encounter: Payer: Self-pay | Admitting: Internal Medicine

## 2022-02-12 VITALS — BP 144/100 | HR 72 | Ht 74.0 in | Wt 214.0 lb

## 2022-02-12 DIAGNOSIS — R9431 Abnormal electrocardiogram [ECG] [EKG]: Secondary | ICD-10-CM

## 2022-02-12 DIAGNOSIS — E782 Mixed hyperlipidemia: Secondary | ICD-10-CM | POA: Diagnosis not present

## 2022-02-12 DIAGNOSIS — I10 Essential (primary) hypertension: Secondary | ICD-10-CM

## 2022-02-12 DIAGNOSIS — Z01818 Encounter for other preprocedural examination: Secondary | ICD-10-CM | POA: Diagnosis not present

## 2022-02-12 MED ORDER — AMLODIPINE BESYLATE 5 MG PO TABS: 5.0000 mg | ORAL_TABLET | Freq: Every day | ORAL | 3 refills | Status: AC

## 2022-02-12 NOTE — Progress Notes (Signed)
Primary Physician/Referring:  [name], MD  Patient ID: Jeremiah Richards, male    DOB: October 17, 1955, 67 y.o.   MRN: [id number]  Chief Complaint  Patient presents with   Abnormal ECG   New Patient (Initial Visit)   HPI:    Jeremiah Richards  is a 67 y.o. male with hypertension and hyperlipidemia who is here to establish care with cardiology prior to having knee surgery.  He was noted to have an abnormal EKG and cardiac clearance was requested prior to proceeding with surgery.  Patient has not had an echo or stress test in a very long time.  He does note that he cannot do the same things he used to do but he attributes this to old age.  He does admit that it is not just his knee that limits him.  He does try to exercise daily.  Patient denies chest pain, shortness of breath, palpitations, diaphoresis, syncope.  He is willing to undergo stress testing prior to having his knee surgery.  He is agreeable to taking a low-dose blood pressure medication as well.  Past Medical History:  Diagnosis Date   BBB (bundle branch block) 02/04/2019   hx rbbb saw br berry jesse cleaver np for 4-14- 2021   Blood dyscrasia 04/2019   left knee clot   Hyperlipidemia    OA (osteoarthritis)    left knee   Prostate cancer ([place]) dx 2021   Ureteral mass    left benign   Past Surgical History:  Procedure Laterality Date   APPENDECTOMY  as child   CYSTOSCOPY WITH URETEROSCOPY Left 09/12/2019   Procedure: CYSTOSCOPY WITH URETEROSCOPY  LEFT RETROGRADE  STENT PLACEMENT;  Surgeon: Lucas Mallow, MD;  Location: [facility];  Service: Urology;  Laterality: Left;   HAND SURGERY Right as child   KNEE SURGERY Left    after snow skiing accident age 60   PELVIC LYMPH NODE DISSECTION Bilateral 10/24/2019   Procedure: BILATERAL PELVIC LYMPH NODE DISSECTION;  Surgeon: Lucas Mallow, MD;  Location: [facility];  Service: Urology;  Laterality: Bilateral;   ROBOT ASSISTED LAPAROSCOPIC RADICAL PROSTATECTOMY N/A  10/24/2019   Procedure: XI ROBOTIC ASSISTED LAPAROSCOPIC RADICAL PROSTATECTOMY;  Surgeon: Lucas Mallow, MD;  Location: [facility];  Service: Urology;  Laterality: N/A;   Family History  Problem Relation Age of Onset   Prostate cancer Father     Social History   Tobacco Use   Smoking status: Former    Packs/day: 1.00    Years: 46.00    Total pack years: 46.00    Types: Cigarettes    Quit date: 08/25/2019    Years since quitting: 2.4   Smokeless tobacco: Never  Substance Use Topics   Alcohol use: Not Currently   Marital Status: Married  ROS  Review of Systems  Cardiovascular:  Positive for irregular heartbeat.   Objective  Blood pressure (!) 144/100, pulse 72, height '6\' 2"'$  (1.88 m), weight 214 lb (97.1 kg), SpO2 96 %. Body mass index is 27.48 kg/m.     02/12/2022    2:39 PM 10/25/2019    1:36 PM 10/25/2019   10:07 AM  Vitals with BMI  Height '6\' 2"'$     Weight 214 lbs    BMI 15.17    Systolic 616 073 710  Diastolic 626 76 78  Pulse 72 74 66     Physical Exam Vitals reviewed.  HENT:     Head: Normocephalic and atraumatic.  Cardiovascular:     Rate and Rhythm: Normal rate and regular rhythm.     Pulses: Normal pulses.     Heart sounds: Normal heart sounds. No murmur heard. Pulmonary:     Effort: Pulmonary effort is normal.     Breath sounds: Normal breath sounds.  Abdominal:     General: Bowel sounds are normal.  Musculoskeletal:     Right lower leg: No edema.     Left lower leg: No edema.  Skin:    General: Skin is warm and dry.  Neurological:     Mental Status: He is alert.     Medications and allergies  No Known Allergies   Medication list after today's encounter   Current Outpatient Medications:    acetaminophen (TYLENOL) 500 MG tablet, Take 500 mg by mouth every 6 (six) hours as needed (knee pain.)., Disp: , Rfl:    amLODipine (NORVASC) 5 MG tablet, Take 1 tablet (5 mg total) by mouth daily., Disp: 180 tablet, Rfl: 3   ibuprofen (ADVIL) 200 MG  tablet, Take 200 mg by mouth every 6 (six) hours as needed., Disp: , Rfl:    Multiple Vitamin (MULTI-VITAMIN) tablet, Take 1 tablet by mouth daily., Disp: , Rfl:    rosuvastatin (CRESTOR) 10 MG tablet, Take 10 mg by mouth daily., Disp: , Rfl:   Laboratory examination:   Lab Results  Component Value Date   NA 138 10/25/2019   K 4.3 10/25/2019   CO2 27 10/25/2019   GLUCOSE 165 (H) 10/25/2019   BUN 15 10/25/2019   CREATININE 1.08 10/25/2019   CALCIUM 8.9 10/25/2019   GFRNONAA >60 10/25/2019       Latest Ref Rng & Units 10/25/2019    5:01 AM 10/19/2019    1:52 PM 04/21/2019    1:23 PM  CMP  Glucose 70 - 99 mg/dL 165  108  96   BUN 8 - 23 mg/dL '15  15  18   '$ Creatinine 0.61 - 1.24 mg/dL 1.08  0.95  0.91   Sodium 135 - 145 mmol/L 138  138  140   Potassium 3.5 - 5.1 mmol/L 4.3  4.8  4.4   Chloride 98 - 111 mmol/L 100  103  106   CO2 22 - 32 mmol/L '27  28  27   '$ Calcium 8.9 - 10.3 mg/dL 8.9  9.0  9.1       Latest Ref Rng & Units 10/25/2019    5:01 AM 10/24/2019    4:50 PM 10/19/2019    1:52 PM  CBC  WBC 4.0 - 10.5 K/uL   8.0   Hemoglobin 13.0 - 17.0 g/dL 12.7  13.2  15.0   Hematocrit 39.0 - 52.0 % 36.9  38.4  44.4   Platelets 150 - 400 K/uL   190     Lipid Panel No results for input(s): "CHOL", "TRIG", "Festus", "VLDL", "HDL", "CHOLHDL", "LDLDIRECT" in the last 8760 hours.  HEMOGLOBIN A1C No results found for: "HGBA1C", "MPG" TSH No results for input(s): "TSH" in the last 8760 hours.  External labs:     Radiology:    Cardiac Studies:     EKG:   02/12/2022: Sinus Rhythm with PAC's and RBBB.  Assessment     ICD-10-CM   1. Abnormal electrocardiogram  R94.31 EKG 12-Lead    PCV ECHOCARDIOGRAM COMPLETE    PCV MYOCARDIAL PERFUSION WO LEXISCAN    2. Essential hypertension  I10 PCV ECHOCARDIOGRAM COMPLETE    PCV MYOCARDIAL PERFUSION WO LEXISCAN  3. Mixed hyperlipidemia  E78.2 PCV ECHOCARDIOGRAM COMPLETE    PCV MYOCARDIAL PERFUSION WO LEXISCAN    4. Pre-op  evaluation  Z01.818        Orders Placed This Encounter  Procedures   PCV MYOCARDIAL PERFUSION WO LEXISCAN    Standing Status:   Future    Standing Expiration Date:   04/13/2022   EKG 12-Lead   PCV ECHOCARDIOGRAM COMPLETE    Standing Status:   Future    Standing Expiration Date:   02/13/2023    Meds ordered this encounter  Medications   amLODipine (NORVASC) 5 MG tablet    Sig: Take 1 tablet (5 mg total) by mouth daily.    Dispense:  180 tablet    Refill:  3    Medications Discontinued During This Encounter  Medication Reason   atorvastatin (LIPITOR) 40 MG tablet Completed Course   HYDROcodone-acetaminophen (NORCO) 5-325 MG tablet Completed Course   sulfamethoxazole-trimethoprim (BACTRIM DS) 800-160 MG tablet Completed Course     Recommendations:   Jeremiah Richards is a 67 y.o.  male with abnormal EKG   Abnormal electrocardiogram Echocardiogram and stress test ordered Patient instructed not to do heavy lifting, heavy exertional activity, swimming until evaluation is complete.  Patient instructed to call if symptoms worse or to go to the ED for further evaluation.   Essential hypertension Continue current cardiac medications. Adding amlodipine to current BP regiment Encourage low-sodium diet, less than 2000 mg daily.   Mixed hyperlipidemia Continue crestor PCP following lipids   Pre-op evaluation Will clear patient for surgery once stress test is resulted  Follow-up in 1-2 months or sooner if needed    Floydene Flock, DO, [facility]  02/14/2022, 3:24 PM Office: [phone] Pager: [phone]

## 2022-02-18 ENCOUNTER — Ambulatory Visit: Payer: PPO

## 2022-02-18 DIAGNOSIS — R9431 Abnormal electrocardiogram [ECG] [EKG]: Secondary | ICD-10-CM

## 2022-02-18 DIAGNOSIS — E782 Mixed hyperlipidemia: Secondary | ICD-10-CM | POA: Diagnosis not present

## 2022-02-18 DIAGNOSIS — I10 Essential (primary) hypertension: Secondary | ICD-10-CM | POA: Diagnosis not present

## 2022-02-24 ENCOUNTER — Other Ambulatory Visit: Payer: PPO

## 2022-03-03 ENCOUNTER — Ambulatory Visit: Payer: PPO

## 2022-03-03 DIAGNOSIS — E782 Mixed hyperlipidemia: Secondary | ICD-10-CM

## 2022-03-03 DIAGNOSIS — I10 Essential (primary) hypertension: Secondary | ICD-10-CM

## 2022-03-03 DIAGNOSIS — R9431 Abnormal electrocardiogram [ECG] [EKG]: Secondary | ICD-10-CM | POA: Diagnosis not present

## 2022-03-04 NOTE — Progress Notes (Signed)
normal

## 2022-03-04 NOTE — Progress Notes (Signed)
Called and spoke with patient regarding his stress test results.

## 2022-03-13 DIAGNOSIS — E7800 Pure hypercholesterolemia, unspecified: Secondary | ICD-10-CM | POA: Diagnosis not present

## 2022-03-18 DIAGNOSIS — C61 Malignant neoplasm of prostate: Secondary | ICD-10-CM | POA: Diagnosis not present

## 2022-03-25 DIAGNOSIS — C61 Malignant neoplasm of prostate: Secondary | ICD-10-CM | POA: Diagnosis not present

## 2022-03-26 ENCOUNTER — Encounter: Payer: Self-pay | Admitting: Internal Medicine

## 2022-03-26 ENCOUNTER — Ambulatory Visit: Payer: PPO | Admitting: Internal Medicine

## 2022-03-26 VITALS — BP 108/77 | HR 70 | Ht 74.0 in | Wt 211.6 lb

## 2022-03-26 DIAGNOSIS — Z01818 Encounter for other preprocedural examination: Secondary | ICD-10-CM

## 2022-03-26 DIAGNOSIS — E782 Mixed hyperlipidemia: Secondary | ICD-10-CM | POA: Diagnosis not present

## 2022-03-26 DIAGNOSIS — I10 Essential (primary) hypertension: Secondary | ICD-10-CM | POA: Diagnosis not present

## 2022-03-26 NOTE — Progress Notes (Unsigned)
Primary Physician/Referring:  [name], MD  Patient ID: Jeremiah Richards, male    DOB: [birth year], 67 y.o.   MRN: [id number]  Chief Complaint  Patient presents with   Abnormal ECG   Follow-up   Results   HPI:    Jeremiah Richards  is a 67 y.o. male with hypertension and hyperlipidemia who is here to establish care with cardiology prior to having knee surgery.  He was noted to have an abnormal EKG and cardiac clearance was requested prior to proceeding with surgery.  Patient has not had an echo or stress test in a very long time.  He does note that he cannot do the same things he used to do but he attributes this to old age.  He does admit that it is not just his knee that limits him.  He does try to exercise daily.  Patient denies chest pain, shortness of breath, palpitations, diaphoresis, syncope.  He is willing to undergo stress testing prior to having his knee surgery.  He is agreeable to taking a low-dose blood pressure medication as well.  Past Medical History:  Diagnosis Date   BBB (bundle branch block) 02/04/2019   hx rbbb saw br berry jesse cleaver np for 4-14- 2021   Blood dyscrasia 04/2019   left knee clot   Hyperlipidemia    OA (osteoarthritis)    left knee   Prostate cancer ([place]) dx 2021   Ureteral mass    left benign   Past Surgical History:  Procedure Laterality Date   APPENDECTOMY  as child   CYSTOSCOPY WITH URETEROSCOPY Left 09/12/2019   Procedure: CYSTOSCOPY WITH URETEROSCOPY  LEFT RETROGRADE  STENT PLACEMENT;  Surgeon: Lucas Mallow, MD;  Location: [facility];  Service: Urology;  Laterality: Left;   HAND SURGERY Right as child   KNEE SURGERY Left    after snow skiing accident age 25   PELVIC LYMPH NODE DISSECTION Bilateral 10/24/2019   Procedure: BILATERAL PELVIC LYMPH NODE DISSECTION;  Surgeon: Lucas Mallow, MD;  Location: [facility];  Service: Urology;  Laterality: Bilateral;   ROBOT ASSISTED LAPAROSCOPIC RADICAL PROSTATECTOMY N/A  10/24/2019   Procedure: XI ROBOTIC ASSISTED LAPAROSCOPIC RADICAL PROSTATECTOMY;  Surgeon: Lucas Mallow, MD;  Location: [facility];  Service: Urology;  Laterality: N/A;   Family History  Problem Relation Age of Onset   Prostate cancer Father     Social History   Tobacco Use   Smoking status: Former    Packs/day: 1.00    Years: 46.00    Total pack years: 46.00    Types: Cigarettes    Quit date: 08/25/2019    Years since quitting: 2.5   Smokeless tobacco: Never  Substance Use Topics   Alcohol use: Not Currently   Marital Status: Married  ROS  Review of Systems  Cardiovascular:  Positive for irregular heartbeat.   Objective  Blood pressure 108/77, pulse 70, height '6\' 2"'$  (1.88 m), weight 211 lb 9.6 oz (96 kg), SpO2 93 %. Body mass index is 27.17 kg/m.     03/26/2022    3:25 PM 02/12/2022    2:39 PM 10/25/2019    1:36 PM  Vitals with BMI  Height '6\' 2"'$  '6\' 2"'$    Weight 211 lbs 10 oz 214 lbs   BMI [id number]   Systolic [id number]  Diastolic 77 [id number] 76  Pulse 70 72 74     Physical Exam Vitals reviewed.  HENT:  Head: Normocephalic and atraumatic.  Cardiovascular:     Rate and Rhythm: Normal rate and regular rhythm.     Pulses: Normal pulses.     Heart sounds: Normal heart sounds. No murmur heard. Pulmonary:     Effort: Pulmonary effort is normal.     Breath sounds: Normal breath sounds.  Abdominal:     General: Bowel sounds are normal.  Musculoskeletal:     Right lower leg: No edema.     Left lower leg: No edema.  Skin:    General: Skin is warm and dry.  Neurological:     Mental Status: He is alert.     Medications and allergies  No Known Allergies   Medication list after today's encounter   Current Outpatient Medications:    b complex vitamins capsule, Take 1 capsule by mouth daily., Disp: , Rfl:    co-enzyme Q-10 30 MG capsule, Take 30 mg by mouth 3 (three) times daily., Disp: , Rfl:    Multiple Vitamin (MULTI-VITAMIN) tablet, Take 1 tablet by mouth  daily., Disp: , Rfl:    rosuvastatin (CRESTOR) 10 MG tablet, Take 10 mg by mouth daily., Disp: , Rfl:    Turmeric 500 MG TABS, Take by mouth., Disp: , Rfl:    amLODipine (NORVASC) 5 MG tablet, Take 1 tablet (5 mg total) by mouth daily. (Patient not taking: Reported on 03/26/2022), Disp: 180 tablet, Rfl: 3  Laboratory examination:   Lab Results  Component Value Date   NA 138 10/25/2019   K 4.3 10/25/2019   CO2 27 10/25/2019   GLUCOSE 165 (H) 10/25/2019   BUN 15 10/25/2019   CREATININE 1.08 10/25/2019   CALCIUM 8.9 10/25/2019   GFRNONAA >60 10/25/2019       Latest Ref Rng & Units 10/25/2019    5:01 AM 10/19/2019    1:52 PM 04/21/2019    1:23 PM  CMP  Glucose 70 - 99 mg/dL 165  108  96   BUN 8 - 23 mg/dL '15  15  18   '$ Creatinine 0.61 - 1.24 mg/dL 1.08  0.95  0.91   Sodium 135 - 145 mmol/L 138  138  140   Potassium 3.5 - 5.1 mmol/L 4.3  4.8  4.4   Chloride 98 - 111 mmol/L 100  103  106   CO2 22 - 32 mmol/L '27  28  27   '$ Calcium 8.9 - 10.3 mg/dL 8.9  9.0  9.1       Latest Ref Rng & Units 10/25/2019    5:01 AM 10/24/2019    4:50 PM 10/19/2019    1:52 PM  CBC  WBC 4.0 - 10.5 K/uL   8.0   Hemoglobin 13.0 - 17.0 g/dL 12.7  13.2  15.0   Hematocrit 39.0 - 52.0 % 36.9  38.4  44.4   Platelets 150 - 400 K/uL   190     Lipid Panel No results for input(s): "CHOL", "TRIG", "[place]", "VLDL", "HDL", "CHOLHDL", "LDLDIRECT" in the last 8760 hours.  HEMOGLOBIN A1C No results found for: "HGBA1C", "MPG" TSH No results for input(s): "TSH" in the last 8760 hours.  External labs:     Radiology:    Cardiac Studies:   Regadenoson (with Mod Bruce protocol) Nuclear stress test 03/03/2022 Myocardial perfusion is normal. Significant diaphragmatic attenuation is present, highly unlikely to be ischemia as perfusion is better in stress images.  Overall LV systolic function is normal without regional wall motion abnormalities. Stress LV EF: 60%. Low risk  study. Nondiagnostic ECG stress. The heart  rate response was consistent with Regadenoson. The blood pressure response was physiologic. No previous exam available for comparison.   Echocardiogram 02/18/2022:  Left ventricle cavity is normal in size and wall thickness. Normal global  wall motion. Normal LV systolic function with visual EF 60-65%. Normal  diastolic filling pattern.  No significant valvular abnormality.  Normal right atrial pressure.    EKG:   02/12/2022: Sinus Rhythm with PAC's and RBBB.  Assessment     ICD-10-CM   1. Pre-op evaluation  Z01.818     2. Essential hypertension  I10     3. Mixed hyperlipidemia  E78.2        No orders of the defined types were placed in this encounter.   No orders of the defined types were placed in this encounter.   Medications Discontinued During This Encounter  Medication Reason   acetaminophen (TYLENOL) 500 MG tablet Completed Course   ibuprofen (ADVIL) 200 MG tablet Completed Course     Recommendations:   Jeremiah Richards is a 67 y.o.  male with abnormal EKG   Abnormal electrocardiogram Echocardiogram and stress test ordered Patient instructed not to do heavy lifting, heavy exertional activity, swimming until evaluation is complete.  Patient instructed to call if symptoms worse or to go to the ED for further evaluation.   Essential hypertension Continue current cardiac medications. Adding amlodipine to current BP regiment Encourage low-sodium diet, less than 2000 mg daily.   Mixed hyperlipidemia Continue crestor PCP following lipids   Pre-op evaluation Will clear patient for surgery once stress test is resulted  Follow-up in 1-2 months or sooner if needed    Floydene Flock, DO, [facility]  03/26/2022, 3:31 PM Office: [phone] Pager: [phone]

## 2022-06-09 DIAGNOSIS — M1712 Unilateral primary osteoarthritis, left knee: Secondary | ICD-10-CM | POA: Diagnosis not present

## 2022-12-18 DIAGNOSIS — Z23 Encounter for immunization: Secondary | ICD-10-CM | POA: Diagnosis not present

## 2022-12-18 DIAGNOSIS — Z1331 Encounter for screening for depression: Secondary | ICD-10-CM | POA: Diagnosis not present

## 2022-12-18 DIAGNOSIS — E7800 Pure hypercholesterolemia, unspecified: Secondary | ICD-10-CM | POA: Diagnosis not present

## 2022-12-18 DIAGNOSIS — Z0000 Encounter for general adult medical examination without abnormal findings: Secondary | ICD-10-CM | POA: Diagnosis not present

## 2022-12-18 DIAGNOSIS — Z9181 History of falling: Secondary | ICD-10-CM | POA: Diagnosis not present

## 2022-12-18 DIAGNOSIS — G4700 Insomnia, unspecified: Secondary | ICD-10-CM | POA: Diagnosis not present

## 2022-12-18 DIAGNOSIS — H9313 Tinnitus, bilateral: Secondary | ICD-10-CM | POA: Diagnosis not present

## 2022-12-18 DIAGNOSIS — Z1211 Encounter for screening for malignant neoplasm of colon: Secondary | ICD-10-CM | POA: Diagnosis not present

## 2022-12-18 DIAGNOSIS — Z8546 Personal history of malignant neoplasm of prostate: Secondary | ICD-10-CM | POA: Diagnosis not present

## 2022-12-18 DIAGNOSIS — J449 Chronic obstructive pulmonary disease, unspecified: Secondary | ICD-10-CM | POA: Diagnosis not present

## 2022-12-18 DIAGNOSIS — F17200 Nicotine dependence, unspecified, uncomplicated: Secondary | ICD-10-CM | POA: Diagnosis not present

## 2023-01-29 DIAGNOSIS — C61 Malignant neoplasm of prostate: Secondary | ICD-10-CM | POA: Diagnosis not present

## 2023-03-23 ENCOUNTER — Ambulatory Visit: Payer: Self-pay | Admitting: Cardiology

## 2023-03-25 ENCOUNTER — Ambulatory Visit: Payer: PPO | Admitting: Internal Medicine

## 2023-03-30 ENCOUNTER — Ambulatory Visit: Payer: PPO | Attending: Internal Medicine | Admitting: Cardiology

## 2023-05-21 DIAGNOSIS — K5730 Diverticulosis of large intestine without perforation or abscess without bleeding: Secondary | ICD-10-CM | POA: Diagnosis not present

## 2023-05-21 DIAGNOSIS — D122 Benign neoplasm of ascending colon: Secondary | ICD-10-CM | POA: Diagnosis not present

## 2023-05-21 DIAGNOSIS — D127 Benign neoplasm of rectosigmoid junction: Secondary | ICD-10-CM | POA: Diagnosis not present

## 2023-05-21 DIAGNOSIS — D126 Benign neoplasm of colon, unspecified: Secondary | ICD-10-CM | POA: Diagnosis not present

## 2023-05-21 DIAGNOSIS — D125 Benign neoplasm of sigmoid colon: Secondary | ICD-10-CM | POA: Diagnosis not present

## 2023-05-21 DIAGNOSIS — D123 Benign neoplasm of transverse colon: Secondary | ICD-10-CM | POA: Diagnosis not present

## 2023-05-21 DIAGNOSIS — R195 Other fecal abnormalities: Secondary | ICD-10-CM | POA: Diagnosis not present

## 2023-08-24 DIAGNOSIS — C61 Malignant neoplasm of prostate: Secondary | ICD-10-CM | POA: Diagnosis not present

## 2023-08-28 DIAGNOSIS — C61 Malignant neoplasm of prostate: Secondary | ICD-10-CM | POA: Diagnosis not present
